# Patient Record
Sex: Male | Born: 1985 | Race: White | Hispanic: No | Marital: Single | Smoking: Never smoker
Health system: Southern US, Community
[De-identification: ages and names within clinical notes are randomized; demographics above are authoritative.]

## PROBLEM LIST (undated history)

## (undated) DIAGNOSIS — F419 Anxiety disorder, unspecified: Secondary | ICD-10-CM

## (undated) DIAGNOSIS — Z87442 Personal history of urinary calculi: Secondary | ICD-10-CM

## (undated) DIAGNOSIS — K219 Gastro-esophageal reflux disease without esophagitis: Secondary | ICD-10-CM

## (undated) HISTORY — PX: WISDOM TOOTH EXTRACTION: SHX21

## (undated) HISTORY — PX: APPENDECTOMY: SHX54

---

## 2007-02-13 ENCOUNTER — Other Ambulatory Visit: Payer: Self-pay

## 2007-02-13 ENCOUNTER — Emergency Department: Payer: Self-pay | Admitting: Emergency Medicine

## 2007-11-22 ENCOUNTER — Emergency Department: Payer: Self-pay | Admitting: Emergency Medicine

## 2007-12-13 ENCOUNTER — Emergency Department: Payer: Self-pay | Admitting: Emergency Medicine

## 2007-12-22 ENCOUNTER — Emergency Department: Payer: Self-pay | Admitting: Emergency Medicine

## 2007-12-23 ENCOUNTER — Ambulatory Visit: Payer: Self-pay | Admitting: Emergency Medicine

## 2008-12-12 IMAGING — CT CT HEAD WITHOUT CONTRAST
2 series · 16 of 30 positions shown, 20 images · non-contrast
Comparison: none

REASON FOR EXAM: ? seizure
COMMENTS:

[Series 2: without · axial · non-contrast · 0.44mm/px · z∈[-124,+0]mm · 13 of 31 slices shown, 17 images]
[im 3/31  brain]
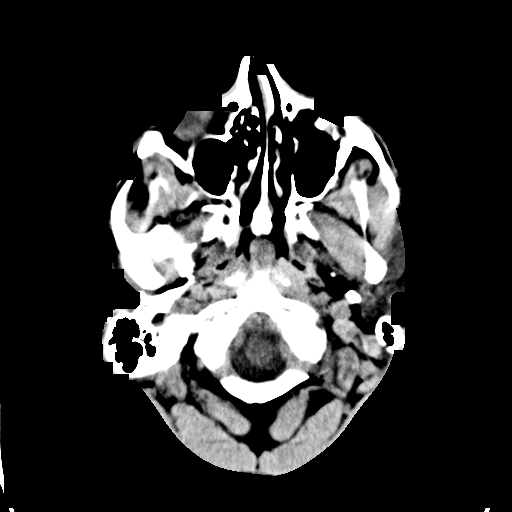
[im 3/31  bone]
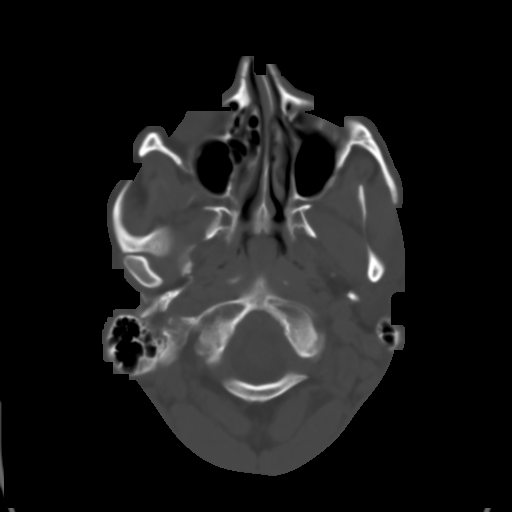
[im 5/31  brain]
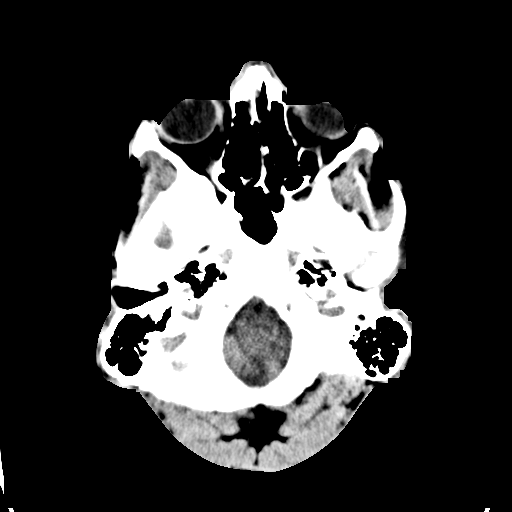
[im 7/31  brain]
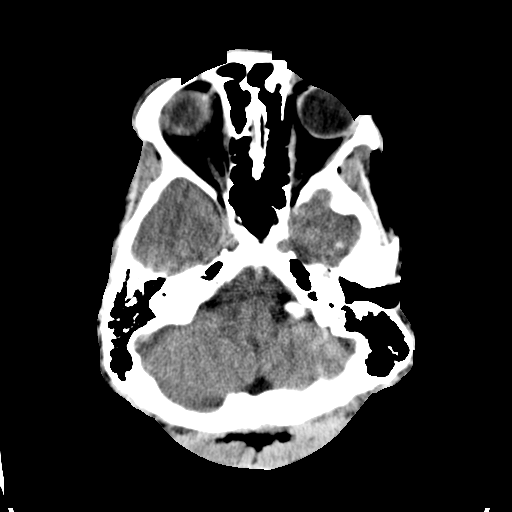
[im 9/31  brain]
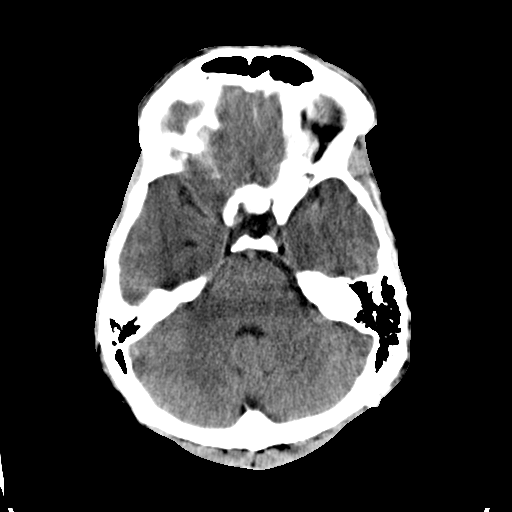
[im 11/31  brain]
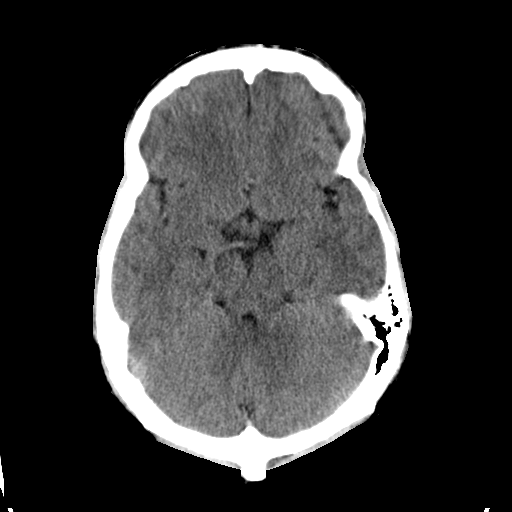
[im 11/31  bone]
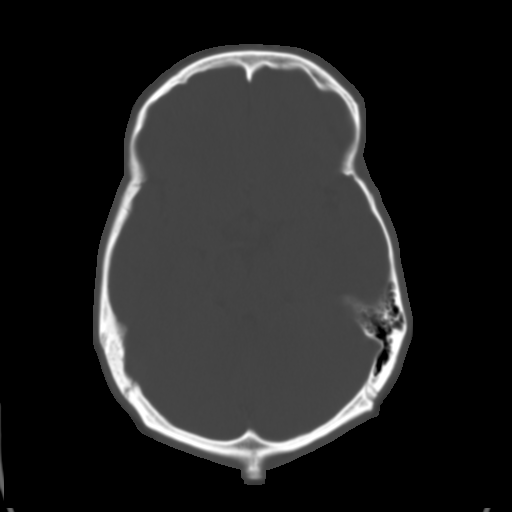
[im 13/31  brain]
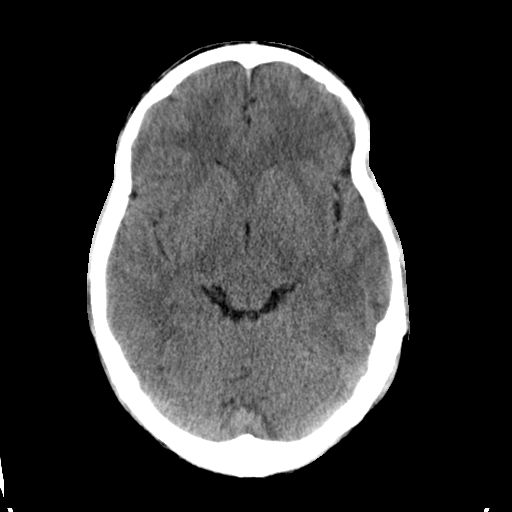
[im 16/31  brain]
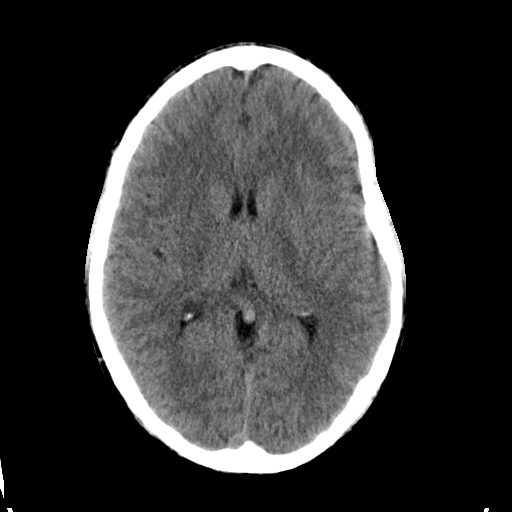
[im 18/31  brain]
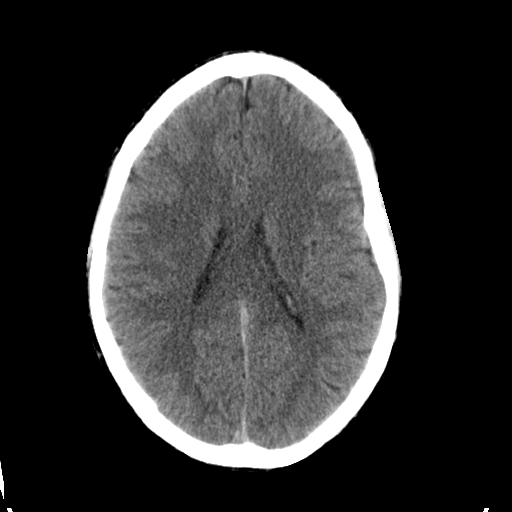
[im 20/31  brain]
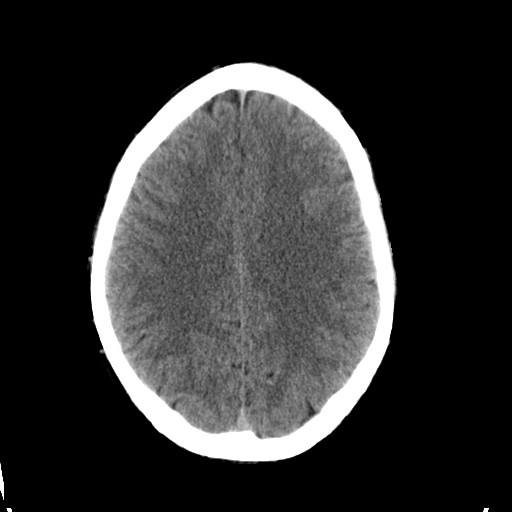
[im 20/31  bone]
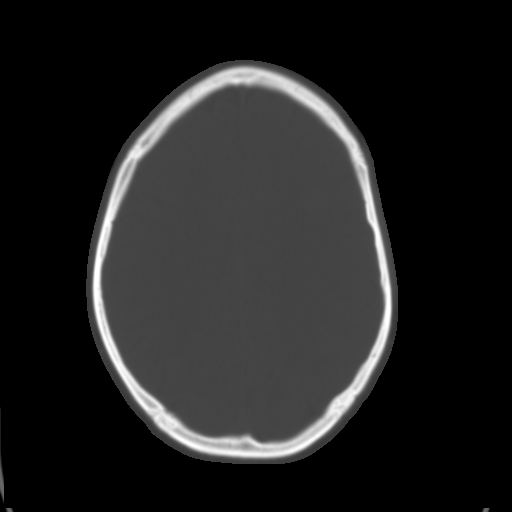
[im 22/31  brain]
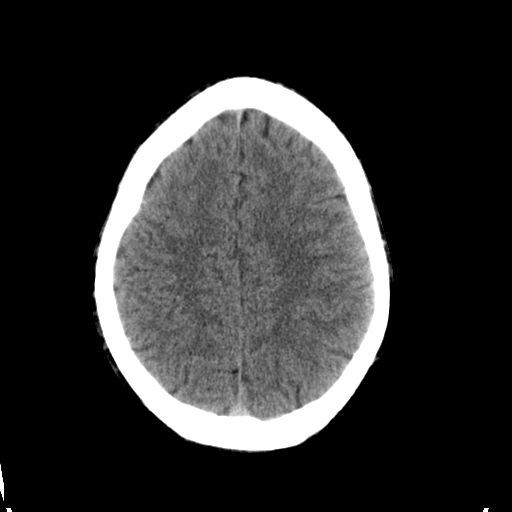
[im 24/31  brain]
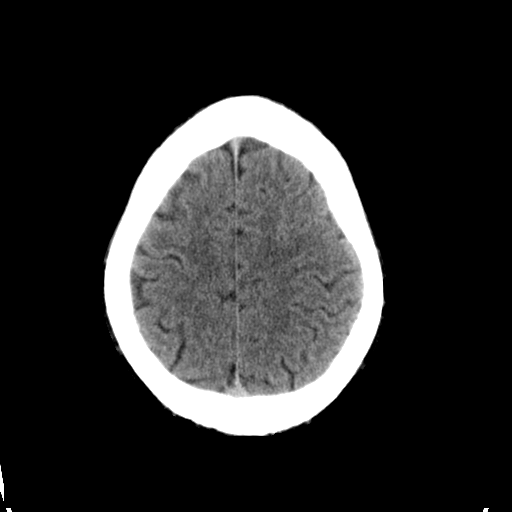
[im 26/31  brain]
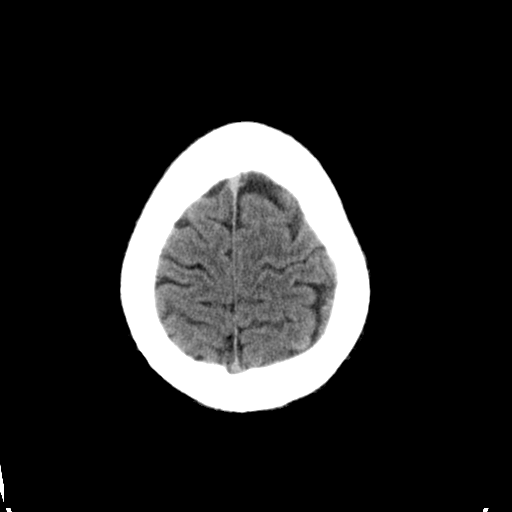
[im 28/31  brain]
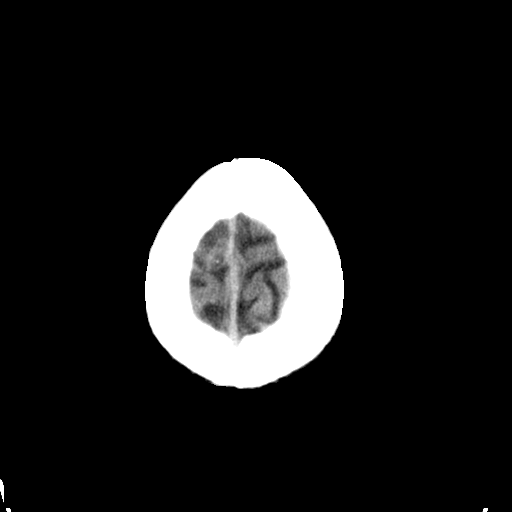
[im 28/31  bone]
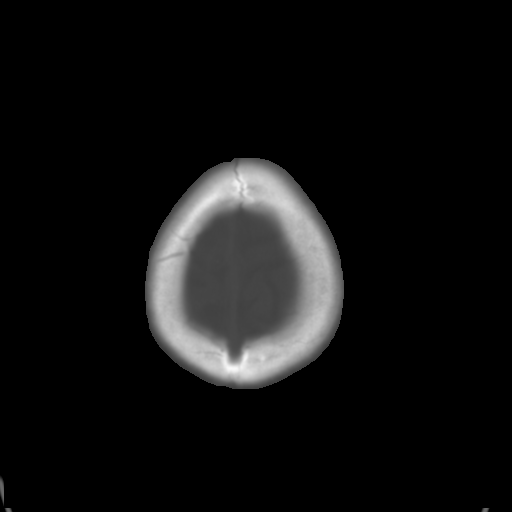

[Series 3: bone · axial · 0.44mm/px · z∈[-124,-84]mm · 3 of 31 slices shown]
[im 3/31  bone]
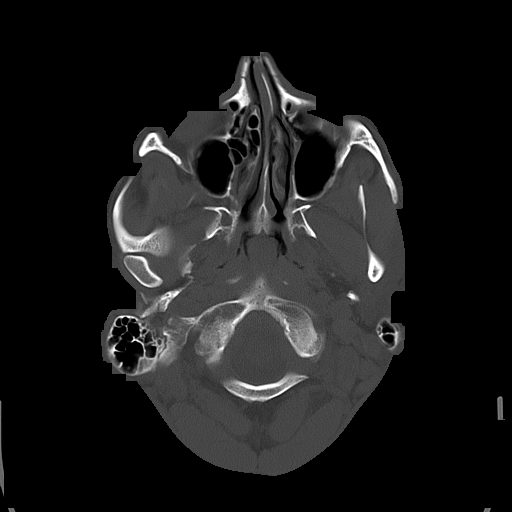
[im 7/31  bone]
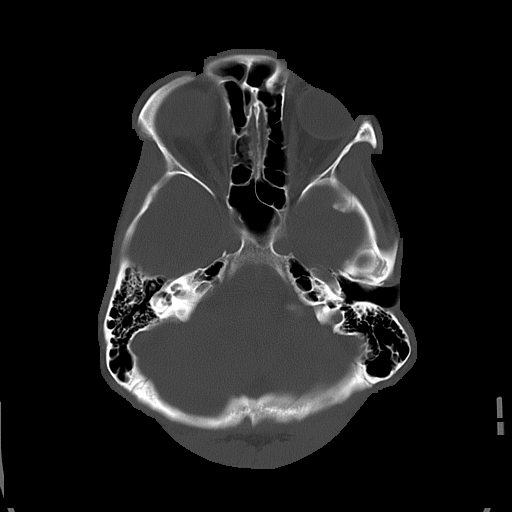
[im 11/31  bone]
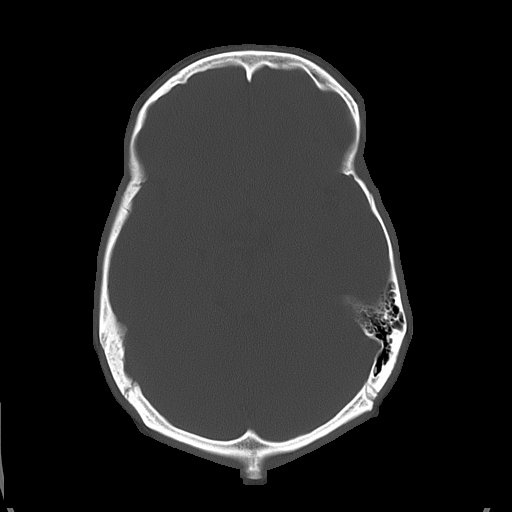

[16 of 30 positions shown; findings below may reference images not displayed]

PROCEDURE:     CT  - CT HEAD WITHOUT CONTRAST  - February 13, 2007  [DATE]

RESULT:     There is no evidence of intra-axial or extra-axial fluid
collections.  There is no evidence of acute hemorrhage or secondary signs
reflecting mass effect, subacute or chronic infarction.  The visualized bony
skeleton is evaluated and there is no evidence of depressed skull fracture
or further fracture or dislocation.  The visualized mastoid air cells are
clear.  The ventricles, cisterns and sulci are symmetric and patent.
IMPRESSION: 1)No evidence of acute intracranial abnormalities as described above.

2)If there is persistent clinical concern, further evaluation with Brain MRI
is recommended if clinically warranted.

Dr. Javaid of the Emergency Room was informed of these findings via a
preliminary faxed report on 02-13-07 at [DATE] Central Time.

## 2008-12-12 IMAGING — CR DG CHEST 1V
1 series · 1 of 1 positions shown · non-contrast
Comparison: none

REASON FOR EXAM: ? seizure
COMMENTS:

PROCEDURE:     DXR - DXR CHEST 1 VIEWAP OR PA  - February 13, 2007  [DATE]
RESULT:     The lungs are clear. The cardiac silhouette and visualized bony
skeleton are unremarkable.

[view not recorded]
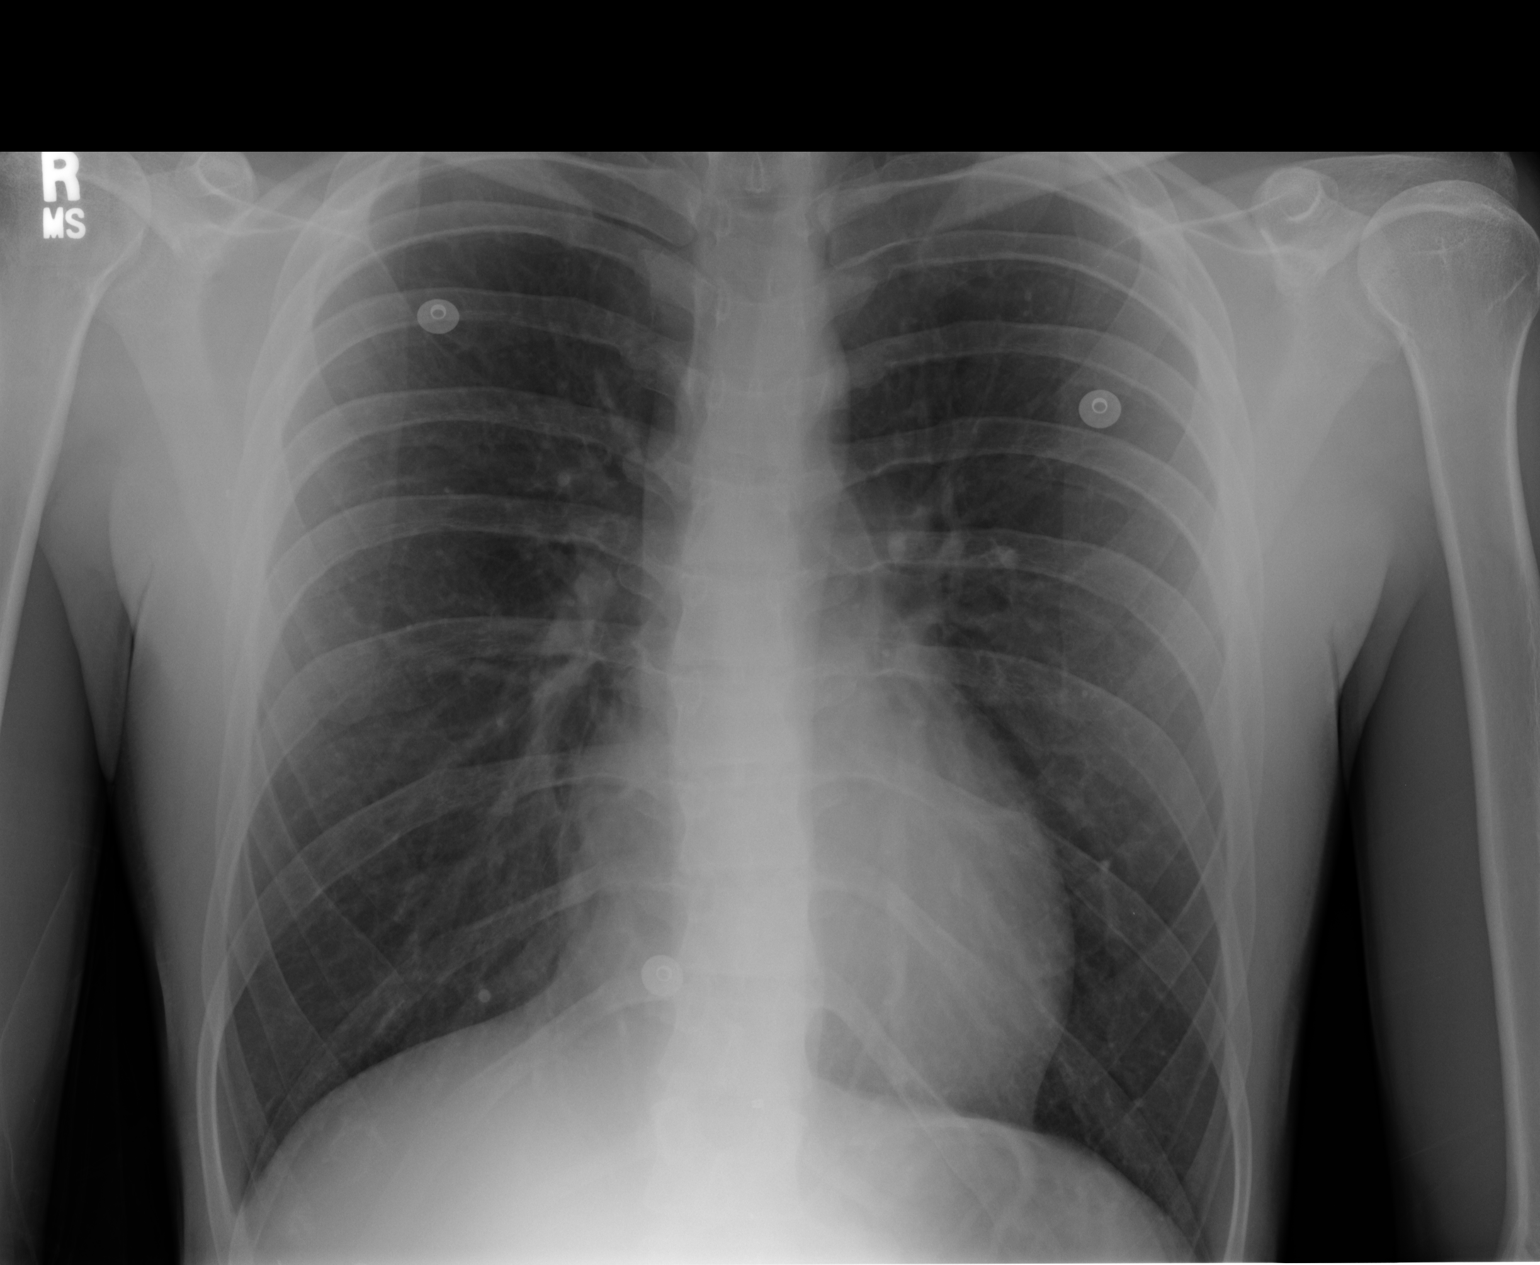

[1 of 1 positions shown; findings below may reference images not displayed]

IMPRESSION: 1)Chest radiograph without evidence of acute cardiopulmonary disease.

## 2011-08-13 ENCOUNTER — Emergency Department: Payer: Self-pay | Admitting: Emergency Medicine

## 2011-09-30 ENCOUNTER — Emergency Department: Payer: Self-pay | Admitting: Emergency Medicine

## 2011-09-30 LAB — TROPONIN I: Troponin-I: <0.02 ng/mL

## 2013-07-29 IMAGING — CR DG CHEST 2V
1 series · 4 of 4 positions shown · non-contrast
Comparison: none

REASON FOR EXAM: chest pain
COMMENTS:

[Series 1: pa · 0.17mm/px · 4 of 4 slices shown]
[im 1/4]
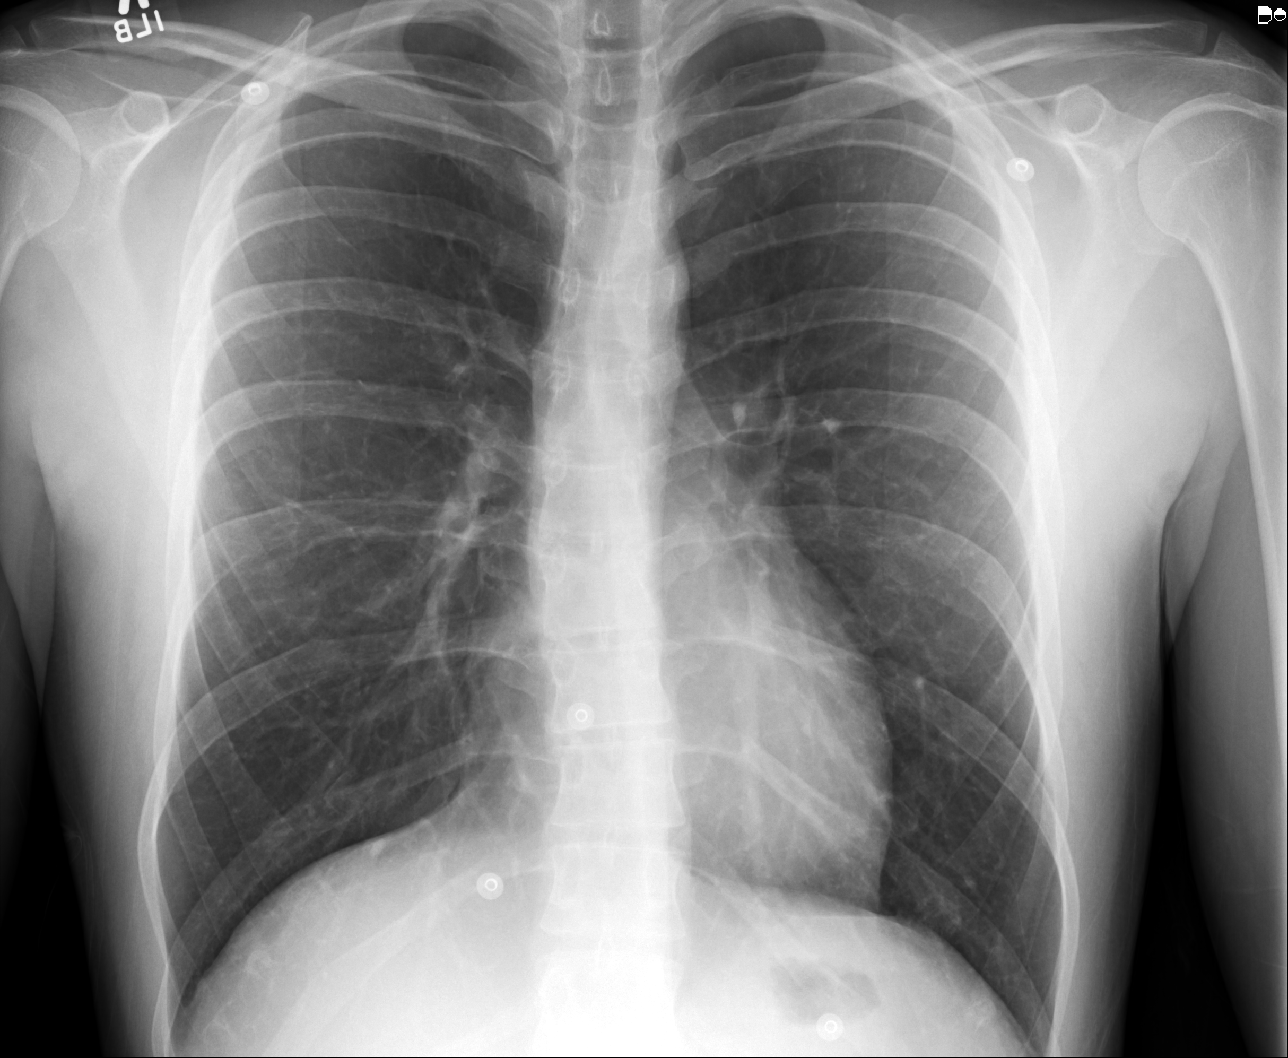
[im 2/4]
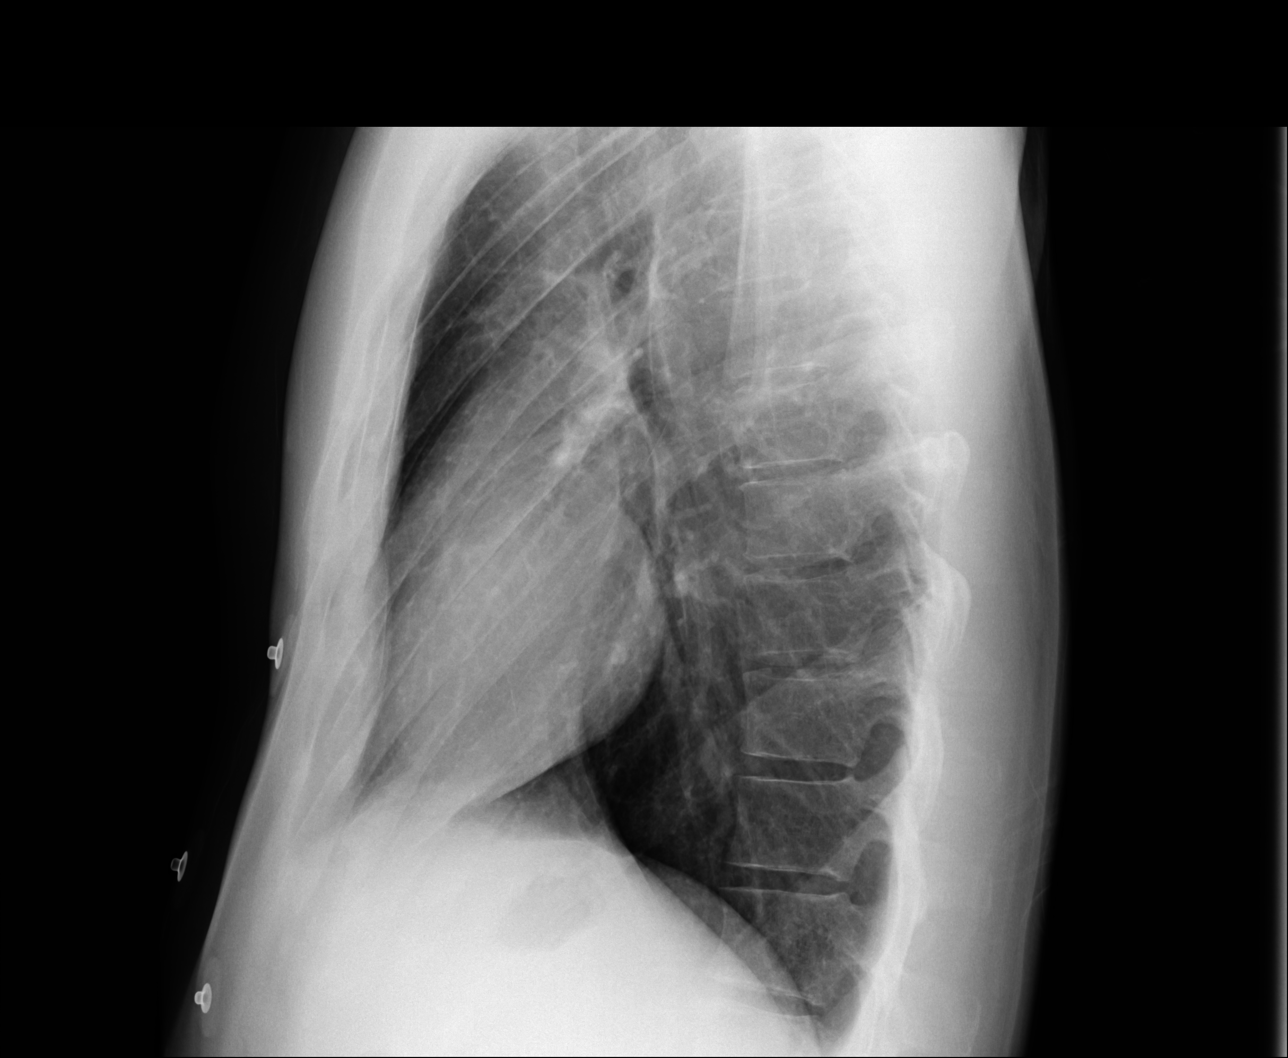
[im 3/4]
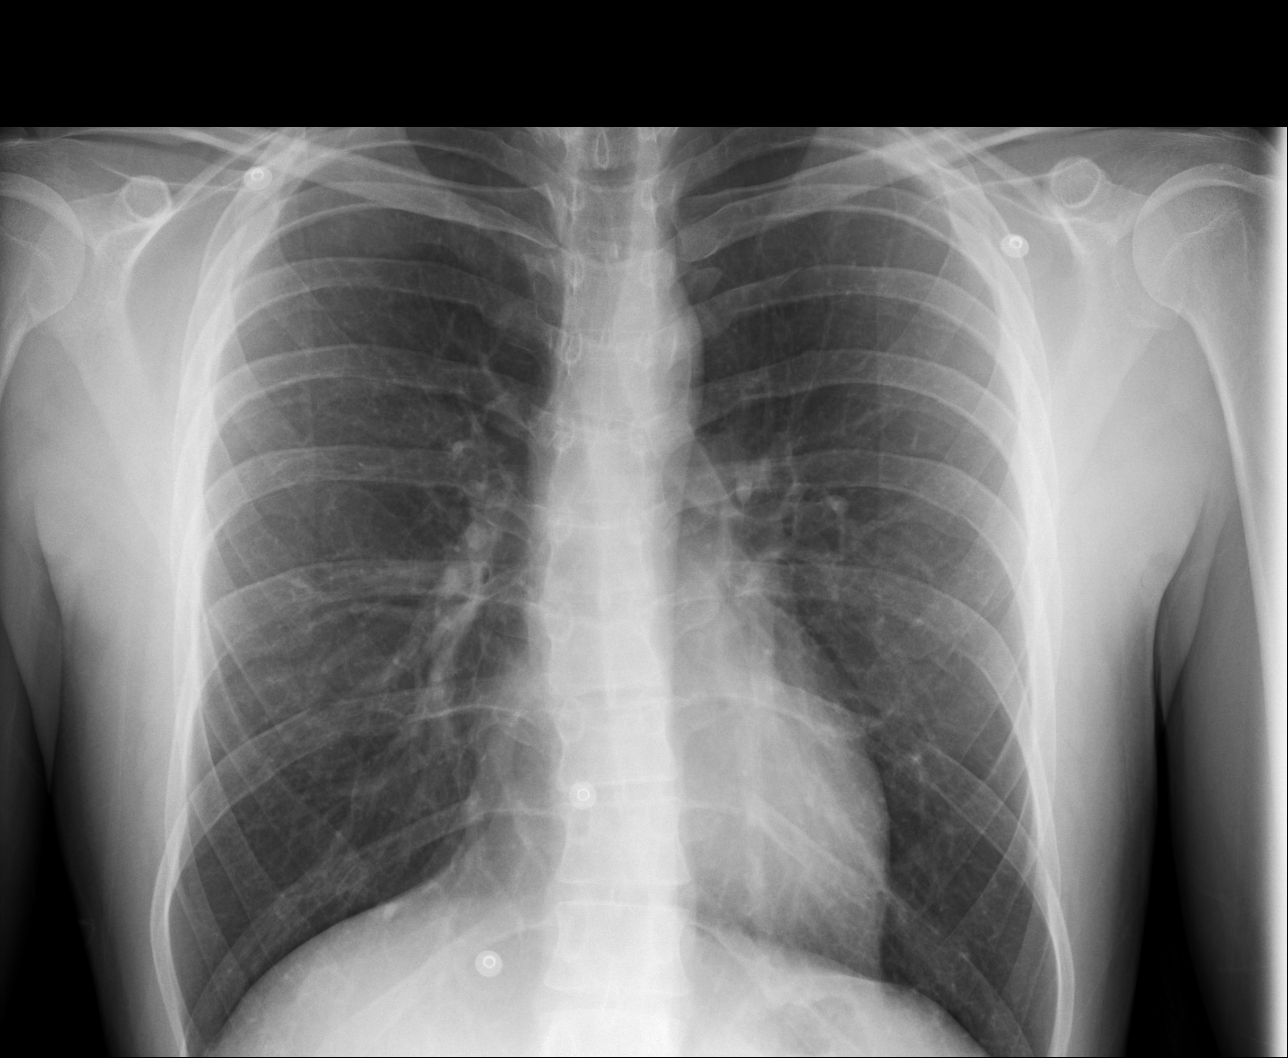
[im 4/4]
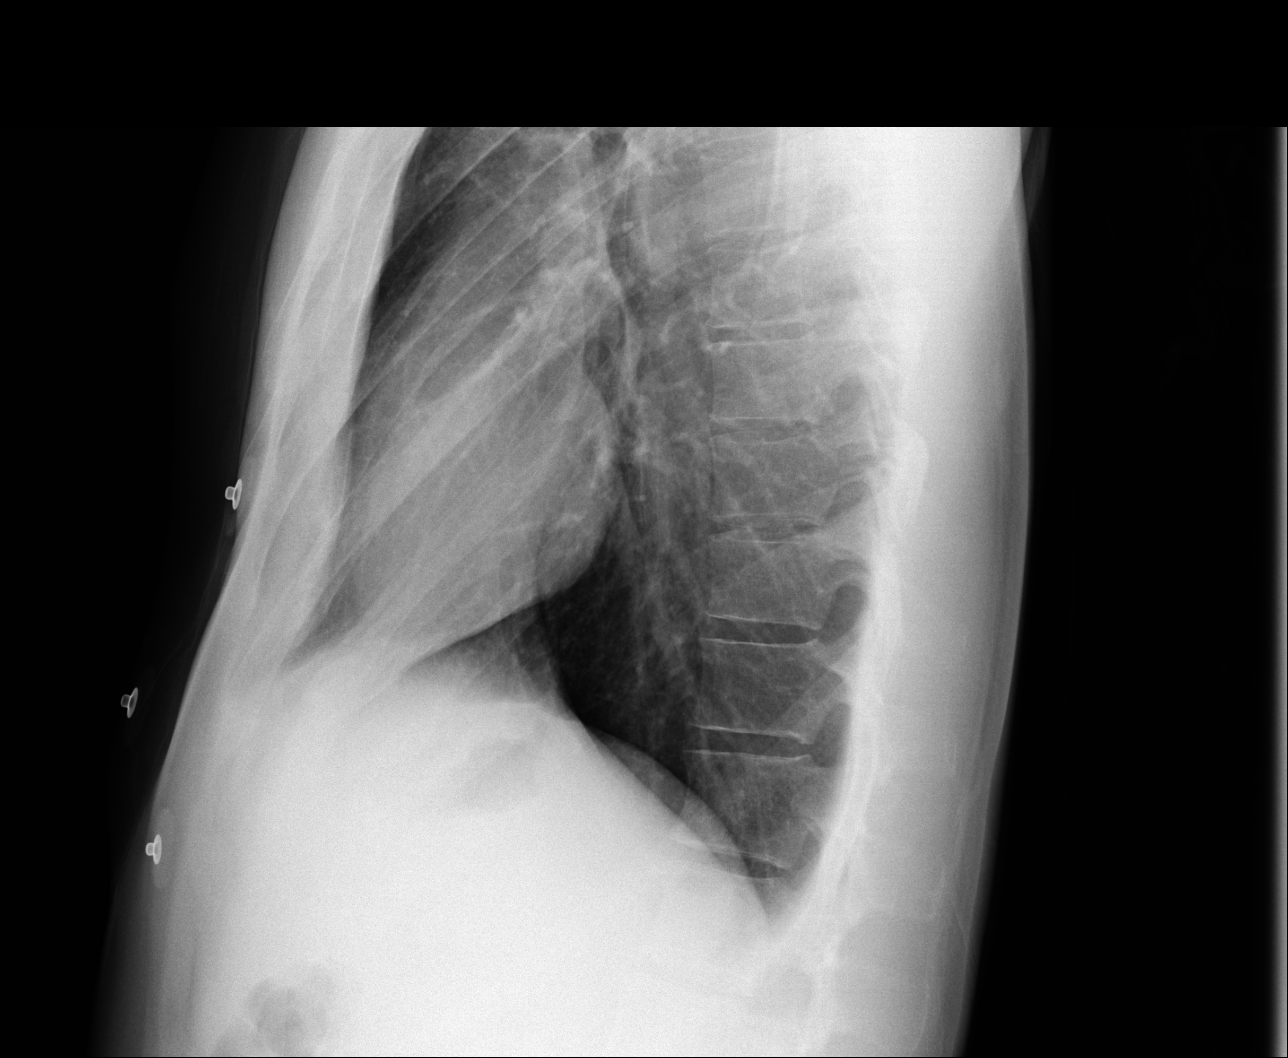

[4 of 4 positions shown; findings below may reference images not displayed]

PROCEDURE:     DXR - DXR CHEST PA (OR AP) AND LATERAL  - September 30, 2011  [DATE]

RESULT:     A comparison is made to the exam of 22 November, 2007.

The lungs are clear. The heart and pulmonary vessels are normal. The bony
and mediastinal structures are unremarkable. There is no effusion. There is
no pneumothorax or evidence of congestive failure.
IMPRESSION: No acute cardiopulmonary disease.

[REDACTED]

## 2019-01-09 HISTORY — PX: OTHER SURGICAL HISTORY: SHX169

## 2020-09-24 ENCOUNTER — Other Ambulatory Visit: Payer: Self-pay | Admitting: Neurosurgery

## 2020-10-01 NOTE — Progress Notes (Signed)
Surgical Instructions    Your procedure is scheduled on 10/12/20.  Report to [facility] Main Entrance "A" at 1:10 P.M., then check in with the Admitting office.  Call this number if you have problems the morning of surgery:  [phone]   If you have any questions prior to your surgery date call [phone]: Open Monday-Friday 8am-4pm    Remember:  Do not eat after midnight the night before your surgery  You may drink clear liquids until 12:00PM the morning of your surgery.   Clear liquids allowed are: Water, Non-Citrus Juices (without pulp), Carbonated Beverages, Clear Tea, Black Coffee Only, and Gatorade    Take these medicines the morning of surgery with A SIP OF WATER  cyclobenzaprine (FLEXERIL) if needed hydrOXYzine (VISTARIL) if needed omeprazole (PRILOSEC)  sertraline (ZOLOFT)    As of today, STOP taking any Aspirin (unless otherwise instructed by your surgeon) Aleve, Naproxen, Ibuprofen, Motrin, Advil, Goody's, BC's, all herbal medications, fish oil, and all vitamins.          Do not wear jewelry or makeup Do not wear lotions, powders, perfumes/colognes, or deodorant. Men may shave face and neck. Do not bring valuables to the hospital.  DO Not wear nail polish, gel polish, artificial nails, or any other type of covering on natural nails  including finger and toenails. If patients have artificial nails, gel coating, etc. that need to be removed by a nail salon please have this removed prior to surgery or surgery may need to be canceled/delayed if the surgeon/ anesthesia feels like the patient is unable to be adequately monitored.             [facility] is not responsible for any belongings or valuables.  Do NOT Smoke (Tobacco/Vaping) or drink Alcohol 24 hours prior to your procedure If you use a CPAP at night, you may bring all equipment for your overnight stay.   Contacts, glasses, dentures or bridgework may not be worn into surgery, please bring cases for these  belongings   For patients admitted to the hospital, discharge time will be determined by your treatment team.   Patients discharged the day of surgery will not be allowed to drive home, and someone needs to stay with them for 24 hours.  ONLY 1 SUPPORT PERSON MAY BE PRESENT WHILE YOU ARE IN SURGERY. IF YOU ARE TO BE ADMITTED ONCE YOU ARE IN YOUR ROOM YOU WILL BE ALLOWED TWO (2) VISITORS.  Minor children may have two parents present. Special consideration for safety and communication needs will be reviewed on a case by case basis.  Special instructions:    Oral Hygiene is also important to reduce your risk of infection.  Remember - BRUSH YOUR TEETH THE MORNING OF SURGERY WITH YOUR REGULAR TOOTHPASTE   [facility]- Preparing For Surgery  Before surgery, you can play an important role. Because skin is not sterile, your skin needs to be as free of germs as possible. You can reduce the number of germs on your skin by washing with CHG (chlorahexidine gluconate) Soap before surgery.  CHG is an antiseptic cleaner which kills germs and bonds with the skin to continue killing germs even after washing.     Please do not use if you have an allergy to CHG or antibacterial soaps. If your skin becomes reddened/irritated stop using the CHG.  Do not shave (including legs and underarms) for at least 48 hours prior to first CHG shower. It is OK to shave your face.  Please  follow these instructions carefully.     Shower the NIGHT BEFORE SURGERY and the MORNING OF SURGERY with CHG Soap.   If you chose to wash your hair, wash your hair first as usual with your normal shampoo. After you shampoo, rinse your hair and body thoroughly to remove the shampoo.  Then [employer] and genitals (private parts) with your normal soap and rinse thoroughly to remove soap.  After that Use CHG Soap as you would any other liquid soap. You can apply CHG directly to the skin and wash gently with a scrungie or a clean washcloth.    Apply the CHG Soap to your body ONLY FROM THE NECK DOWN.  Do not use on open wounds or open sores. Avoid contact with your eyes, ears, mouth and genitals (private parts). Wash Face and genitals (private parts)  with your normal soap.   Wash thoroughly, paying special attention to the area where your surgery will be performed.  Thoroughly rinse your body with warm water from the neck down.  DO NOT shower/wash with your normal soap after using and rinsing off the CHG Soap.  Pat yourself dry with a CLEAN TOWEL.  Wear CLEAN PAJAMAS to bed the night before surgery  Place CLEAN SHEETS on your bed the night before your surgery  DO NOT SLEEP WITH PETS.   Day of Surgery: Take a shower with CHG soap. Wear Clean/Comfortable clothing the morning of surgery Do not apply any deodorants/lotions.   Remember to brush your teeth WITH YOUR REGULAR TOOTHPASTE.   Please read over the following fact sheets that you were given.

## 2020-10-02 ENCOUNTER — Encounter (HOSPITAL_COMMUNITY)
Admission: RE | Admit: 2020-10-02 | Discharge: 2020-10-02 | Disposition: A | Discharge status: Home / Self Care | Payer: No Typology Code available for payment source | Source: Ambulatory Visit | Attending: Neurosurgery | Admitting: Neurosurgery

## 2020-10-02 ENCOUNTER — Other Ambulatory Visit: Payer: Self-pay

## 2020-10-02 ENCOUNTER — Encounter (HOSPITAL_COMMUNITY): Payer: Self-pay

## 2020-10-02 DIAGNOSIS — Z01812 Encounter for preprocedural laboratory examination: Secondary | ICD-10-CM | POA: Diagnosis not present

## 2020-10-02 HISTORY — DX: Personal history of urinary calculi: Z87.442

## 2020-10-02 HISTORY — DX: Gastro-esophageal reflux disease without esophagitis: K21.9

## 2020-10-02 HISTORY — DX: Anxiety disorder, unspecified: F41.9

## 2020-10-02 LAB — CBC
HCT: 45.5 % (ref 39.0–52.0)
Hemoglobin: 16 g/dL (ref 13.0–17.0)
MCH: 31.1 pg (ref 26.0–34.0)
MCHC: 35.2 g/dL (ref 30.0–36.0)
MCV: 88.5 fL (ref 80.0–100.0)
Platelets: 259 10*3/uL (ref 150–400)
RBC: 5.14 MIL/uL (ref 4.22–5.81)
RDW: 12.6 % (ref 11.5–15.5)
WBC: 6.2 10*3/uL (ref 4.0–10.5)
nRBC: 0 % (ref 0.0–0.2)

## 2020-10-02 LAB — SURGICAL PCR SCREEN
MRSA, PCR: NEGATIVE
Staphylococcus aureus: NEGATIVE

## 2020-10-02 LAB — TYPE AND SCREEN
ABO/RH(D): B POS
Antibody Screen: NEGATIVE

## 2020-10-02 NOTE — Progress Notes (Addendum)
PCP - Thornell Sartorius, PA-C Cardiologist - denies Neurologist: Dr. Lorenso Courier at [facility] (notes in care everywhere)  PPM/ICD - denies   Chest x-ray - n/a EKG - 04/2020 (records requested) Stress Test - denies ECHO - denies Cardiac Cath - denies  Sleep Study - denies   No diabetes  As of today, STOP taking any Aspirin (unless otherwise instructed by your surgeon) Aleve, Naproxen, Ibuprofen, Motrin, Advil, Goody's, BC's, all herbal medications, fish oil, and all vitamins.  ERAS Protcol -yes PRE-SURGERY Ensure or G2- no  COVID TEST- informed patient to go to [facility] location and get covid tested anytime between 8/1-8/4   Anesthesia review: no  Patient denies shortness of breath, fever, cough and chest pain at PAT appointment   All instructions explained to the patient, with a verbal understanding of the material. Patient agrees to go over the instructions while at home for a better understanding. Patient also instructed to self quarantine after being tested for COVID-19. The opportunity to ask questions was provided.

## 2020-10-08 ENCOUNTER — Other Ambulatory Visit: Payer: Self-pay | Admitting: Neurosurgery

## 2020-10-09 LAB — SARS CORONAVIRUS 2 (TAT 6-24 HRS): SARS Coronavirus 2: NEGATIVE

## 2020-10-12 ENCOUNTER — Ambulatory Visit (HOSPITAL_COMMUNITY): Payer: No Typology Code available for payment source | Admitting: Certified Registered Nurse Anesthetist

## 2020-10-12 ENCOUNTER — Observation Stay (HOSPITAL_COMMUNITY)
Admission: RE | Admit: 2020-10-12 | Discharge: 2020-10-13 | Disposition: A | Discharge status: Home / Self Care | Payer: No Typology Code available for payment source | Attending: Neurosurgery | Admitting: Neurosurgery

## 2020-10-12 ENCOUNTER — Encounter (HOSPITAL_COMMUNITY)
Admission: RE | Disposition: A | Discharge status: Home / Self Care | Payer: Self-pay | Source: Home / Self Care | Attending: Neurosurgery

## 2020-10-12 ENCOUNTER — Ambulatory Visit (HOSPITAL_COMMUNITY): Payer: No Typology Code available for payment source

## 2020-10-12 ENCOUNTER — Encounter (HOSPITAL_COMMUNITY): Payer: Self-pay | Admitting: Neurosurgery

## 2020-10-12 ENCOUNTER — Other Ambulatory Visit: Payer: Self-pay

## 2020-10-12 DIAGNOSIS — M5020 Other cervical disc displacement, unspecified cervical region: Secondary | ICD-10-CM | POA: Diagnosis present

## 2020-10-12 DIAGNOSIS — Z419 Encounter for procedure for purposes other than remedying health state, unspecified: Secondary | ICD-10-CM

## 2020-10-12 DIAGNOSIS — M4722 Other spondylosis with radiculopathy, cervical region: Secondary | ICD-10-CM | POA: Insufficient documentation

## 2020-10-12 DIAGNOSIS — R030 Elevated blood-pressure reading, without diagnosis of hypertension: Secondary | ICD-10-CM | POA: Diagnosis not present

## 2020-10-12 DIAGNOSIS — M4802 Spinal stenosis, cervical region: Secondary | ICD-10-CM | POA: Diagnosis not present

## 2020-10-12 DIAGNOSIS — M50122 Cervical disc disorder at C5-C6 level with radiculopathy: Principal | ICD-10-CM | POA: Insufficient documentation

## 2020-10-12 HISTORY — PX: CERVICAL DISC ARTHROPLASTY: SHX587

## 2020-10-12 LAB — ABO/RH: ABO/RH(D): B POS

## 2020-10-12 SURGERY — CERVICAL ANTERIOR DISC ARTHROPLASTY
Anesthesia: General

## 2020-10-12 MED ORDER — OXYCODONE HCL 5 MG PO TABS
5.0000 mg | ORAL_TABLET | Freq: Once | ORAL | Status: AC | PRN
  Administered 2020-10-12: 5 mg via ORAL

## 2020-10-12 MED ORDER — SODIUM CHLORIDE 0.9 % IV SOLN: 250.0000 mL | INTRAVENOUS | Status: DC

## 2020-10-12 MED ORDER — MIDAZOLAM HCL 2 MG/2ML IJ SOLN
INTRAMUSCULAR | Status: DC | PRN
Start: 2020-10-12 — End: 2020-10-12
  Administered 2020-10-12: 2 mg via INTRAVENOUS

## 2020-10-12 MED ORDER — OXYCODONE HCL 5 MG PO TABS
5.0000 mg | ORAL_TABLET | ORAL | Status: DC | PRN
  Administered 2020-10-13: 5 mg via ORAL
  Filled 2020-10-12: qty 1

## 2020-10-12 MED ORDER — ACETAMINOPHEN 160 MG/5ML PO SOLN: 325.0000 mg | ORAL | Status: DC | PRN

## 2020-10-12 MED ORDER — CEFAZOLIN SODIUM-DEXTROSE 2-4 GM/100ML-% IV SOLN
2.0000 g | INTRAVENOUS | Status: AC
  Administered 2020-10-12: 2 g via INTRAVENOUS
  Filled 2020-10-12: qty 100

## 2020-10-12 MED ORDER — DEXAMETHASONE SODIUM PHOSPHATE 10 MG/ML IJ SOLN
INTRAMUSCULAR | Status: DC | PRN
  Administered 2020-10-12: 10 mg via INTRAVENOUS

## 2020-10-12 MED ORDER — CYCLOBENZAPRINE HCL 10 MG PO TABS
10.0000 mg | ORAL_TABLET | Freq: Two times a day (BID) | ORAL | Status: DC | PRN
  Administered 2020-10-12 – 2020-10-13 (×2): 10 mg via ORAL
  Filled 2020-10-12 (×2): qty 1

## 2020-10-12 MED ORDER — THROMBIN 5000 UNITS EX SOLR: OROMUCOSAL | Status: DC | PRN

## 2020-10-12 MED ORDER — FENTANYL CITRATE (PF) 100 MCG/2ML IJ SOLN
INTRAMUSCULAR | Status: AC
  Filled 2020-10-12: qty 2

## 2020-10-12 MED ORDER — DOCUSATE SODIUM 100 MG PO CAPS
100.0000 mg | ORAL_CAPSULE | Freq: Two times a day (BID) | ORAL | Status: DC
  Administered 2020-10-12 – 2020-10-13 (×2): 100 mg via ORAL
  Filled 2020-10-12 (×2): qty 1

## 2020-10-12 MED ORDER — ROCURONIUM BROMIDE 10 MG/ML (PF) SYRINGE
PREFILLED_SYRINGE | INTRAVENOUS | Status: AC
  Filled 2020-10-12: qty 10

## 2020-10-12 MED ORDER — PROPOFOL 10 MG/ML IV BOLUS
INTRAVENOUS | Status: DC | PRN
  Administered 2020-10-12: 200 mg via INTRAVENOUS

## 2020-10-12 MED ORDER — HYDROCODONE-ACETAMINOPHEN 5-325 MG PO TABS
2.0000 | ORAL_TABLET | ORAL | Status: DC | PRN
  Administered 2020-10-13 (×2): 2 via ORAL
  Filled 2020-10-12 (×2): qty 2

## 2020-10-12 MED ORDER — SODIUM CHLORIDE 0.9% FLUSH: 3.0000 mL | INTRAVENOUS | Status: DC | PRN

## 2020-10-12 MED ORDER — CHLORHEXIDINE GLUCONATE CLOTH 2 % EX PADS: 6.0000 | MEDICATED_PAD | Freq: Once | CUTANEOUS | Status: DC

## 2020-10-12 MED ORDER — FENTANYL CITRATE (PF) 250 MCG/5ML IJ SOLN
INTRAMUSCULAR | Status: AC
  Filled 2020-10-12: qty 5

## 2020-10-12 MED ORDER — ACETAMINOPHEN 325 MG PO TABS
650.0000 mg | ORAL_TABLET | ORAL | Status: DC | PRN
  Administered 2020-10-13: 650 mg via ORAL
  Filled 2020-10-12: qty 2

## 2020-10-12 MED ORDER — THROMBIN 5000 UNITS EX SOLR
CUTANEOUS | Status: AC
  Filled 2020-10-12: qty 5000

## 2020-10-12 MED ORDER — ZOLPIDEM TARTRATE 5 MG PO TABS
10.0000 mg | ORAL_TABLET | Freq: Every day | ORAL | Status: DC
  Administered 2020-10-12: 10 mg via ORAL
  Filled 2020-10-12: qty 2

## 2020-10-12 MED ORDER — KETOROLAC TROMETHAMINE 15 MG/ML IJ SOLN
15.0000 mg | Freq: Four times a day (QID) | INTRAMUSCULAR | Status: DC
Start: 2020-10-12 — End: 2020-10-13
  Administered 2020-10-12 – 2020-10-13 (×3): 15 mg via INTRAVENOUS
  Filled 2020-10-12 (×3): qty 1

## 2020-10-12 MED ORDER — AMISULPRIDE (ANTIEMETIC) 5 MG/2ML IV SOLN
INTRAVENOUS | Status: AC
  Filled 2020-10-12: qty 4

## 2020-10-12 MED ORDER — OXYCODONE HCL 5 MG PO TABS
ORAL_TABLET | ORAL | Status: AC
  Filled 2020-10-12: qty 1

## 2020-10-12 MED ORDER — ZOLPIDEM TARTRATE 5 MG PO TABS: 5.0000 mg | ORAL_TABLET | Freq: Every evening | ORAL | Status: DC | PRN

## 2020-10-12 MED ORDER — FENTANYL CITRATE (PF) 100 MCG/2ML IJ SOLN
25.0000 ug | INTRAMUSCULAR | Status: DC | PRN
  Administered 2020-10-12 (×3): 50 ug via INTRAVENOUS

## 2020-10-12 MED ORDER — POLYETHYLENE GLYCOL 3350 17 G PO PACK: 17.0000 g | PACK | Freq: Every day | ORAL | Status: DC | PRN

## 2020-10-12 MED ORDER — ACETAMINOPHEN 325 MG PO TABS: 325.0000 mg | ORAL_TABLET | ORAL | Status: DC | PRN

## 2020-10-12 MED ORDER — MIDAZOLAM HCL 2 MG/2ML IJ SOLN
INTRAMUSCULAR | Status: AC
  Filled 2020-10-12: qty 2

## 2020-10-12 MED ORDER — LACTATED RINGERS IV SOLN: INTRAVENOUS | Status: DC | PRN

## 2020-10-12 MED ORDER — PANTOPRAZOLE SODIUM 40 MG IV SOLR
40.0000 mg | Freq: Every day | INTRAVENOUS | Status: DC
  Administered 2020-10-12: 40 mg via INTRAVENOUS
  Filled 2020-10-12: qty 40

## 2020-10-12 MED ORDER — LIDOCAINE-EPINEPHRINE 1 %-1:100000 IJ SOLN
INTRAMUSCULAR | Status: AC
  Filled 2020-10-12: qty 1

## 2020-10-12 MED ORDER — HYDROXYZINE PAMOATE 25 MG PO CAPS
25.0000 mg | ORAL_CAPSULE | Freq: Three times a day (TID) | ORAL | Status: DC | PRN
  Filled 2020-10-12: qty 1

## 2020-10-12 MED ORDER — BUPIVACAINE HCL (PF) 0.5 % IJ SOLN
INTRAMUSCULAR | Status: DC | PRN
  Administered 2020-10-12: 5 mL

## 2020-10-12 MED ORDER — ACETAMINOPHEN 10 MG/ML IV SOLN
1000.0000 mg | Freq: Once | INTRAVENOUS | Status: DC | PRN
  Administered 2020-10-12: 1000 mg via INTRAVENOUS

## 2020-10-12 MED ORDER — ACETAMINOPHEN 10 MG/ML IV SOLN
INTRAVENOUS | Status: AC
  Filled 2020-10-12: qty 100

## 2020-10-12 MED ORDER — ONDANSETRON HCL 4 MG/2ML IJ SOLN: 4.0000 mg | Freq: Four times a day (QID) | INTRAMUSCULAR | Status: DC | PRN

## 2020-10-12 MED ORDER — 0.9 % SODIUM CHLORIDE (POUR BTL) OPTIME
TOPICAL | Status: DC | PRN
  Administered 2020-10-12: 1000 mL

## 2020-10-12 MED ORDER — BUPIVACAINE HCL (PF) 0.5 % IJ SOLN
INTRAMUSCULAR | Status: AC
  Filled 2020-10-12: qty 30

## 2020-10-12 MED ORDER — HYDROXYZINE HCL 25 MG PO TABS: 25.0000 mg | ORAL_TABLET | Freq: Three times a day (TID) | ORAL | Status: DC | PRN

## 2020-10-12 MED ORDER — LIDOCAINE-EPINEPHRINE 1 %-1:100000 IJ SOLN
INTRAMUSCULAR | Status: DC | PRN
  Administered 2020-10-12: 5 mL

## 2020-10-12 MED ORDER — ROCURONIUM BROMIDE 10 MG/ML (PF) SYRINGE
PREFILLED_SYRINGE | INTRAVENOUS | Status: DC | PRN
  Administered 2020-10-12: 70 mg via INTRAVENOUS
  Administered 2020-10-12: 10 mg via INTRAVENOUS

## 2020-10-12 MED ORDER — PROPOFOL 10 MG/ML IV BOLUS
INTRAVENOUS | Status: AC
  Filled 2020-10-12: qty 20

## 2020-10-12 MED ORDER — METHOCARBAMOL 1000 MG/10ML IJ SOLN
500.0000 mg | Freq: Four times a day (QID) | INTRAVENOUS | Status: DC | PRN
  Filled 2020-10-12: qty 5

## 2020-10-12 MED ORDER — ONDANSETRON HCL 4 MG/2ML IJ SOLN
INTRAMUSCULAR | Status: DC | PRN
  Administered 2020-10-12: 4 mg via INTRAVENOUS

## 2020-10-12 MED ORDER — CHLORHEXIDINE GLUCONATE 0.12 % MT SOLN
15.0000 mL | Freq: Once | OROMUCOSAL | Status: AC
  Administered 2020-10-12: 15 mL via OROMUCOSAL
  Filled 2020-10-12: qty 15

## 2020-10-12 MED ORDER — AMISULPRIDE (ANTIEMETIC) 5 MG/2ML IV SOLN
10.0000 mg | Freq: Once | INTRAVENOUS | Status: AC | PRN
  Administered 2020-10-12: 10 mg via INTRAVENOUS

## 2020-10-12 MED ORDER — PROMETHAZINE HCL 25 MG/ML IJ SOLN: 6.2500 mg | INTRAMUSCULAR | Status: DC | PRN

## 2020-10-12 MED ORDER — LIDOCAINE 2% (20 MG/ML) 5 ML SYRINGE
INTRAMUSCULAR | Status: AC
  Filled 2020-10-12: qty 5

## 2020-10-12 MED ORDER — ORAL CARE MOUTH RINSE: 15.0000 mL | Freq: Once | OROMUCOSAL | Status: AC

## 2020-10-12 MED ORDER — ONDANSETRON HCL 4 MG/2ML IJ SOLN
INTRAMUSCULAR | Status: AC
  Filled 2020-10-12: qty 2

## 2020-10-12 MED ORDER — MENTHOL 3 MG MT LOZG: 1.0000 | LOZENGE | OROMUCOSAL | Status: DC | PRN

## 2020-10-12 MED ORDER — PHENOL 1.4 % MT LIQD: 1.0000 | OROMUCOSAL | Status: DC | PRN

## 2020-10-12 MED ORDER — ALUM & MAG HYDROXIDE-SIMETH 200-200-20 MG/5ML PO SUSP: 30.0000 mL | Freq: Four times a day (QID) | ORAL | Status: DC | PRN

## 2020-10-12 MED ORDER — HYDROMORPHONE HCL 1 MG/ML IJ SOLN
0.5000 mg | INTRAMUSCULAR | Status: DC | PRN
Start: 2020-10-12 — End: 2020-10-13

## 2020-10-12 MED ORDER — OXYCODONE HCL 5 MG/5ML PO SOLN: 5.0000 mg | Freq: Once | ORAL | Status: AC | PRN

## 2020-10-12 MED ORDER — FENTANYL CITRATE (PF) 250 MCG/5ML IJ SOLN
INTRAMUSCULAR | Status: DC | PRN
  Administered 2020-10-12: 100 ug via INTRAVENOUS
  Administered 2020-10-12: 50 ug via INTRAVENOUS
  Administered 2020-10-12: 100 ug via INTRAVENOUS

## 2020-10-12 MED ORDER — ONDANSETRON HCL 4 MG PO TABS: 4.0000 mg | ORAL_TABLET | Freq: Four times a day (QID) | ORAL | Status: DC | PRN

## 2020-10-12 MED ORDER — LACTATED RINGERS IV SOLN: INTRAVENOUS | Status: DC

## 2020-10-12 MED ORDER — BISACODYL 10 MG RE SUPP: 10.0000 mg | Freq: Every day | RECTAL | Status: DC | PRN

## 2020-10-12 MED ORDER — SERTRALINE HCL 50 MG PO TABS
50.0000 mg | ORAL_TABLET | Freq: Every day | ORAL | Status: DC
  Administered 2020-10-13: 50 mg via ORAL
  Filled 2020-10-12: qty 1

## 2020-10-12 MED ORDER — CEFAZOLIN SODIUM-DEXTROSE 2-4 GM/100ML-% IV SOLN
2.0000 g | Freq: Three times a day (TID) | INTRAVENOUS | Status: AC
Start: 2020-10-12 — End: 2020-10-13
  Administered 2020-10-12 – 2020-10-13 (×2): 2 g via INTRAVENOUS
  Filled 2020-10-12 (×2): qty 100

## 2020-10-12 MED ORDER — KCL IN DEXTROSE-NACL 20-5-0.45 MEQ/L-%-% IV SOLN: INTRAVENOUS | Status: DC

## 2020-10-12 MED ORDER — DEXAMETHASONE SODIUM PHOSPHATE 10 MG/ML IJ SOLN
INTRAMUSCULAR | Status: AC
  Filled 2020-10-12: qty 1

## 2020-10-12 MED ORDER — FLEET ENEMA 7-19 GM/118ML RE ENEM: 1.0000 | ENEMA | Freq: Once | RECTAL | Status: DC | PRN

## 2020-10-12 MED ORDER — PANTOPRAZOLE SODIUM 40 MG PO TBEC: 40.0000 mg | DELAYED_RELEASE_TABLET | Freq: Every day | ORAL | Status: DC

## 2020-10-12 MED ORDER — SODIUM CHLORIDE 0.9% FLUSH: 3.0000 mL | Freq: Two times a day (BID) | INTRAVENOUS | Status: DC

## 2020-10-12 MED ORDER — ACETAMINOPHEN 650 MG RE SUPP: 650.0000 mg | RECTAL | Status: DC | PRN

## 2020-10-12 MED ORDER — SUGAMMADEX SODIUM 200 MG/2ML IV SOLN
INTRAVENOUS | Status: DC | PRN
  Administered 2020-10-12: 300 mg via INTRAVENOUS

## 2020-10-12 MED ORDER — METHOCARBAMOL 500 MG PO TABS
500.0000 mg | ORAL_TABLET | Freq: Four times a day (QID) | ORAL | Status: DC | PRN
  Administered 2020-10-13: 500 mg via ORAL
  Filled 2020-10-12: qty 1

## 2020-10-12 MED ORDER — LIDOCAINE 2% (20 MG/ML) 5 ML SYRINGE
INTRAMUSCULAR | Status: DC | PRN
  Administered 2020-10-12: 30 mg via INTRAVENOUS

## 2020-10-12 SURGICAL SUPPLY — 53 items
BAG COUNTER SPONGE SURGICOUNT (BAG) ×4 IMPLANT
BAND RUBBER #18 3X1/16 STRL (MISCELLANEOUS) ×4 IMPLANT
BASKET BONE COLLECTION (BASKET) IMPLANT
BIT DRILL NEURO 2X3.1 SFT TUCH (MISCELLANEOUS) ×1 IMPLANT
BLADE ULTRA TIP 2M (BLADE) IMPLANT
BUR BARREL STRAIGHT FLUTE 4.0 (BURR) ×2 IMPLANT
CANISTER SUCT 3000ML PPV (MISCELLANEOUS) ×2 IMPLANT
CARTRIDGE OIL MAESTRO DRILL (MISCELLANEOUS) ×1 IMPLANT
DECANTER SPIKE VIAL GLASS SM (MISCELLANEOUS) IMPLANT
DERMABOND ADVANCED (GAUZE/BANDAGES/DRESSINGS) ×1
DERMABOND ADVANCED .7 DNX12 (GAUZE/BANDAGES/DRESSINGS) ×1 IMPLANT
DIFFUSER DRILL AIR PNEUMATIC (MISCELLANEOUS) ×2 IMPLANT
DISC MOBI-C CERVICAL 17X17 H5 (Screw) ×2 IMPLANT
DRAPE C-ARM 42X72 X-RAY (DRAPES) ×6 IMPLANT
DRAPE HALF SHEET 40X57 (DRAPES) IMPLANT
DRAPE LAPAROTOMY 100X72 PEDS (DRAPES) ×2 IMPLANT
DRAPE MICROSCOPE LEICA (MISCELLANEOUS) ×2 IMPLANT
DRILL NEURO 2X3.1 SOFT TOUCH (MISCELLANEOUS) ×2
DRSG OPSITE POSTOP 4X6 (GAUZE/BANDAGES/DRESSINGS) ×2 IMPLANT
DRSG PAD ABDOMINAL 8X10 ST (GAUZE/BANDAGES/DRESSINGS) ×4 IMPLANT
DURAPREP 6ML APPLICATOR 50/CS (WOUND CARE) ×2 IMPLANT
ELECT COATED BLADE 2.86 ST (ELECTRODE) ×2 IMPLANT
ELECT REM PT RETURN 9FT ADLT (ELECTROSURGICAL) ×2
ELECTRODE REM PT RTRN 9FT ADLT (ELECTROSURGICAL) ×1 IMPLANT
GAUZE 4X4 16PLY ~~LOC~~+RFID DBL (SPONGE) IMPLANT
GAUZE SPONGE 4X4 12PLY STRL (GAUZE/BANDAGES/DRESSINGS) IMPLANT
GLOVE EXAM NITRILE XL STR (GLOVE) IMPLANT
GLOVE SRG 8 PF TXTR STRL LF DI (GLOVE) ×1 IMPLANT
GLOVE SURG ENC MOIS LTX SZ8 (GLOVE) ×2 IMPLANT
GLOVE SURG LTX SZ8 (GLOVE) ×2 IMPLANT
GLOVE SURG UNDER POLY LF SZ8 (GLOVE) ×1
GLOVE SURG UNDER POLY LF SZ8.5 (GLOVE) ×2 IMPLANT
GOWN STRL REUS W/ TWL LRG LVL3 (GOWN DISPOSABLE) IMPLANT
GOWN STRL REUS W/ TWL XL LVL3 (GOWN DISPOSABLE) ×1 IMPLANT
GOWN STRL REUS W/TWL 2XL LVL3 (GOWN DISPOSABLE) ×2 IMPLANT
GOWN STRL REUS W/TWL LRG LVL3 (GOWN DISPOSABLE)
GOWN STRL REUS W/TWL XL LVL3 (GOWN DISPOSABLE) ×1
HEMOSTAT POWDER KIT SURGIFOAM (HEMOSTASIS) ×2 IMPLANT
KIT BASIN OR (CUSTOM PROCEDURE TRAY) ×2 IMPLANT
KIT TURNOVER KIT B (KITS) ×2 IMPLANT
NEEDLE HYPO 25X1 1.5 SAFETY (NEEDLE) ×2 IMPLANT
NEEDLE SPNL 22GX3.5 QUINCKE BK (NEEDLE) ×2 IMPLANT
NS IRRIG 1000ML POUR BTL (IV SOLUTION) ×2 IMPLANT
OIL CARTRIDGE MAESTRO DRILL (MISCELLANEOUS) ×2
PACK LAMINECTOMY NEURO (CUSTOM PROCEDURE TRAY) ×2 IMPLANT
PAD ARMBOARD 7.5X6 YLW CONV (MISCELLANEOUS) IMPLANT
PIN DISTRACTION 14MM (PIN) ×4 IMPLANT
SPONGE INTESTINAL PEANUT (DISPOSABLE) ×2 IMPLANT
SPONGE SURGIFOAM ABS GEL SZ50 (HEMOSTASIS) IMPLANT
SUT VIC AB 3-0 SH 8-18 (SUTURE) ×2 IMPLANT
TOWEL GREEN STERILE (TOWEL DISPOSABLE) ×2 IMPLANT
TOWEL GREEN STERILE FF (TOWEL DISPOSABLE) ×2 IMPLANT
WATER STERILE IRR 1000ML POUR (IV SOLUTION) ×2 IMPLANT

## 2020-10-12 NOTE — Brief Op Note (Signed)
10/12/2020  4:20 PM  PATIENT:  Anthony Compton  35 y.o. male  PRE-OPERATIVE DIAGNOSIS:  Herniated nucleus pulposus, cervical, cervical spondylosis, cervical stenosis, cervicalgia, cervical radiculopathy C 56 level  POST-OPERATIVE DIAGNOSIS:  Herniated nucleus pulposus, cervical, cervical spondylosis, cervical stenosis, cervicalgia, cervical radiculopathy C 56 level  PROCEDURE:  Procedure(s) with comments: Cervical 5-6 anterior cervical arthroplasty (N/A) - 3C/RM 21 with Mobi C disc arthroplasty (17 x 17 x 5 mm)  SURGEON:  Surgeon(s) and Role:    Maeola Harman, MD - Primary  PHYSICIAN ASSISTANT:   ASSISTANTS: Poteat, RN   ANESTHESIA:   general  EBL:  5 cc  BLOOD ADMINISTERED:none  DRAINS: none   LOCAL MEDICATIONS USED:  MARCAINE    and LIDOCAINE   SPECIMEN:  No Specimen  DISPOSITION OF SPECIMEN:  N/A  COUNTS:  YES  TOURNIQUET:  * No tourniquets in log *  DICTATION: Patient was brought to operating room and following the smooth and uncomplicated induction of general endotracheal anesthesia his head was placed on a donut head holder and his anterior neck was prepped and draped in usual sterile fashion. An incision was made on the left side of midline after infiltrating the skin and subcutaneous tissues with local lidocaine. The platysmal layer was incised and subplatysmal dissection was performed exposing the anterior border sternocleidomastoid muscle. Using blunt dissection the carotid sheath was kept lateral and trachea and esophagus kept medial exposing the anterior cervical spine. A bent spinal needle was placed it was felt to be the C 56 level and this was confirmed on intraoperative x-ray. Longus coli muscles were taken down from the anterior cervical spine using electrocautery and key elevator and self-retaining retractor was placed. The interspace was incised and a thorough discectomy was performed.  14 mm Distraction pins were placed. The spinal cord dura and both C 6 nerve  roots were widely decompressed. A large ruptured disc was removed which was compressing the left C 6 nerve root.  Hemostasis was assured. After trial sizing a large deep 5 mm Mobi C disc arthroplasty implant (17 x 17 x 5 mm) was sized, then tamped into position and countersunk aoppropriately.  It's positioning was confirmed with AP and lateral fluoroscopy.  Soft tissues were inspected and found to be in good repair. The wound was irrigated. The platysma layer was closed with 3-0 Vicryl stitches and the skin was reapproximated with 3-0 Vicryl subcuticular stitches. The wound was dressed with Dermabond and an occlusive dressing. Counts were correct at the end of the case. Patient was extubated and taken to recovery in stable and satisfactory condition.   PLAN OF CARE: Admit for overnight observation  PATIENT DISPOSITION:  PACU - hemodynamically stable.   Delay start of Pharmacological VTE agent (>24hrs) due to surgical blood loss or risk of bleeding: yes

## 2020-10-12 NOTE — Transfer of Care (Signed)
Immediate Anesthesia Transfer of Care Note  Patient: Anthony Compton  Procedure(s) Performed: Cervical 5-6 anterior cervical arthroplasty  Patient Location: PACU  Anesthesia Type:General  Level of Consciousness: awake, alert  and oriented  Airway & Oxygen Therapy: Patient Spontanous Breathing  Post-op Assessment: Report given to RN and Post -op Vital signs reviewed and stable  Post vital signs: Reviewed and stable  Last Vitals:  Vitals Value Taken Time  BP 113/80 10/12/20 1607  Temp    Pulse 107 10/12/20 1609  Resp 25 10/12/20 1609  SpO2 99 % 10/12/20 1609  Vitals shown include unvalidated device data.  Last Pain:  Vitals:   10/12/20 1231  TempSrc:   PainSc: 9       Patients Stated Pain Goal: 3 (10/12/20 1231)  Complications: No notable events documented.

## 2020-10-12 NOTE — Interval H&P Note (Signed)
History and Physical Interval Note:  10/12/2020 1:33 PM  Anthony Compton  has presented today for surgery, with the diagnosis of Herniated nucleus pulposus, cervical.  The various methods of treatment have been discussed with the patient and family. After consideration of risks, benefits and other options for treatment, the patient has consented to  Procedure(s) with comments: Cervical 5-6 anterior cervical arthroplasty (N/A) - 3C/RM 21 as a surgical intervention.  The patient's history has been reviewed, patient examined, no change in status, stable for surgery.  I have reviewed the patient's chart and labs.  Questions were answered to the patient's satisfaction.     Dorian Heckle

## 2020-10-12 NOTE — Op Note (Signed)
10/12/2020  4:20 PM  PATIENT:  Anthony Compton  34 y.o. male  PRE-OPERATIVE DIAGNOSIS:  Herniated nucleus pulposus, cervical, cervical spondylosis, cervical stenosis, cervicalgia, cervical radiculopathy C 56 level  POST-OPERATIVE DIAGNOSIS:  Herniated nucleus pulposus, cervical, cervical spondylosis, cervical stenosis, cervicalgia, cervical radiculopathy C 56 level  PROCEDURE:  Procedure(s) with comments: Cervical 5-6 anterior cervical arthroplasty (N/A) - 3C/RM 21 with Mobi C disc arthroplasty (17 x 17 x 5 mm)  SURGEON:  Surgeon(s) and Role:    * Jamilett Ferrante, MD - Primary  PHYSICIAN ASSISTANT:   ASSISTANTS: Poteat, RN   ANESTHESIA:   general  EBL:  5 cc  BLOOD ADMINISTERED:none  DRAINS: none   LOCAL MEDICATIONS USED:  MARCAINE    and LIDOCAINE   SPECIMEN:  No Specimen  DISPOSITION OF SPECIMEN:  N/A  COUNTS:  YES  TOURNIQUET:  * No tourniquets in log *  DICTATION: Patient was brought to operating room and following the smooth and uncomplicated induction of general endotracheal anesthesia his head was placed on a donut head holder and his anterior neck was prepped and draped in usual sterile fashion. An incision was made on the left side of midline after infiltrating the skin and subcutaneous tissues with local lidocaine. The platysmal layer was incised and subplatysmal dissection was performed exposing the anterior border sternocleidomastoid muscle. Using blunt dissection the carotid sheath was kept lateral and trachea and esophagus kept medial exposing the anterior cervical spine. A bent spinal needle was placed it was felt to be the C 56 level and this was confirmed on intraoperative x-ray. Longus coli muscles were taken down from the anterior cervical spine using electrocautery and key elevator and self-retaining retractor was placed. The interspace was incised and a thorough discectomy was performed.  14 mm Distraction pins were placed. The spinal cord dura and both C 6 nerve  roots were widely decompressed. A large ruptured disc was removed which was compressing the left C 6 nerve root.  Hemostasis was assured. After trial sizing a large deep 5 mm Mobi C disc arthroplasty implant (17 x 17 x 5 mm) was sized, then tamped into position and countersunk aoppropriately.  It's positioning was confirmed with AP and lateral fluoroscopy.  Soft tissues were inspected and found to be in good repair. The wound was irrigated. The platysma layer was closed with 3-0 Vicryl stitches and the skin was reapproximated with 3-0 Vicryl subcuticular stitches. The wound was dressed with Dermabond and an occlusive dressing. Counts were correct at the end of the case. Patient was extubated and taken to recovery in stable and satisfactory condition.   PLAN OF CARE: Admit for overnight observation  PATIENT DISPOSITION:  PACU - hemodynamically stable.   Delay start of Pharmacological VTE agent (>24hrs) due to surgical blood loss or risk of bleeding: yes  
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
## 2020-10-12 NOTE — Anesthesia Preprocedure Evaluation (Addendum)
Anesthesia Evaluation  Patient identified by MRN, date of birth, ID band Patient awake    Reviewed: Allergy & Precautions, NPO status , Patient's Chart, lab work & pertinent test results  Airway Mallampati: I  TM Distance: >3 FB Neck ROM: Full    Dental  (+) Teeth Intact, Dental Advisory Given   Pulmonary    breath sounds clear to auscultation       Cardiovascular negative cardio ROS   Rhythm:Regular Rate:Normal     Neuro/Psych Anxiety    GI/Hepatic Neg liver ROS, GERD  Medicated,  Endo/Other  negative endocrine ROS  Renal/GU negative Renal ROS     Musculoskeletal negative musculoskeletal ROS (+)   Abdominal Normal abdominal exam  (+)   Peds  Hematology negative hematology ROS (+)   Anesthesia Other Findings   Reproductive/Obstetrics                            Anesthesia Physical Anesthesia Plan  ASA: 2  Anesthesia Plan: General   Post-op Pain Management:    Induction: Intravenous  PONV Risk Score and Plan: 3 and Ondansetron, Dexamethasone and Midazolam  Airway Management Planned: Oral ETT and Video Laryngoscope Planned  Additional Equipment: None  Intra-op Plan:   Post-operative Plan: Extubation in OR  Informed Consent: I have reviewed the patients History and Physical, chart, labs and discussed the procedure including the risks, benefits and alternatives for the proposed anesthesia with the patient or authorized representative who has indicated his/her understanding and acceptance.     Dental advisory given  Plan Discussed with: CRNA  Anesthesia Plan Comments:       Anesthesia Quick Evaluation

## 2020-10-13 DIAGNOSIS — M50122 Cervical disc disorder at C5-C6 level with radiculopathy: Secondary | ICD-10-CM | POA: Diagnosis not present

## 2020-10-13 MED ORDER — HYDROCODONE-ACETAMINOPHEN 5-325 MG PO TABS: 1.0000 | ORAL_TABLET | ORAL | 0 refills | Status: AC | PRN

## 2020-10-13 NOTE — Discharge Instructions (Addendum)
Wound Care Leave incision open to air. You may shower. Do not scrub directly on incision.  Do not put any creams, lotions, or ointments on incision. Activity Walk each and every day, increasing distance each day. No lifting greater than 5 lbs.  Avoid excessive neck motion. No driving for 2 weeks; may ride as a passenger locally. Wear neck brace at all times except when showering.  If provided soft collar, may wear for comfort unless otherwise instructed. Diet Resume your normal diet.  Return to Work Will be discussed at you follow up appointment. Call Your Doctor If Any of These Occur Redness, drainage, or swelling at the wound.  Temperature greater than 101 degrees. Severe pain not relieved by pain medication. Increased difficulty swallowing. Incision starts to come apart. Follow Up Appt Call today for appointment in 3-4 weeks ([phone]) or for problems.  If you have any hardware placed in your spine, you will need an x-ray before your appointment.           Anterior Cervical Fusion Care After Pinching of the nerves is a common cause of long-term pain. When this happens, a procedure called an anterior cervical fusion is sometimes performed. It relieves the pressure on the pinched nerve roots or spinal cord in the neck. An anterior cervical fusion means that the operation is done through the front (anterior) of your neck to fuse bones in your neck together. This procedure is done to relieve the pressure on pinched nerve roots or spinal cord. This operation is done to control the movement of your spine, which may be pressing on the nerves. This may relieve the pain. The procedure that stops the movement of the spine is called a fusion. The cut by the surgeon (incision) is usually within a skin fold line under your chin. After moving the neck muscles gently apart, the neurosurgeon uses an operating microscope and removes the injured intervertebral disk (the cushion or pad of tissue  between the bones of the spine). This takes the pressure off the nerves or spinal cord. This is called decompression. The area where the disc was removed is then filled with a bone graft. The graft will fuse the vertebrae together over time. This means it causes the vertebral bodies to grow together. The bone graft may be obtained from your own bone (your hip for example), or may be obtained from a bone bank. Receiving bone from a bone bank is similar to a blood bank, only the bone comes from human donors who have recently died. This type of graft is referred to as allograft bone. The preformed bone plug is safe and will not be rejected by your body. It does not contain blood cells. In some cases, the surgeon may use hardware in your neck to help stabilize it. This means that metal plates or pins or screws may be used to: Provide extra support to the neck.  Help the bones to grow together more easily.  A cervical fusion procedure takes a couple hours to several hours, depending on what needs to be done. Your caregiver will be able to answer your questions for you. HOME CARE INSTRUCTIONS  It will be normal to have a sore throat and have difficulty swallowing foods for a couple weeks following surgery. See your caregiver if this seems to be getting worse rather than better.  You may resume normal diet and activities as directed or allowed. Generally, walking and stair climbing are fine. Avoid lifting more than ten pounds and  do no lifting above your head.  If given a cervical collar, remove only for bathing and eating, or as directed.  Use only showers for cleaning up, with no bathing, until seen.  You may apply ice to the surgical or bone donor site for 15 to 20 minutes each hour while awake for the first couple days following surgery. Put the ice in a plastic bag and place a towel between the bag of ice and your skin.  Change dressings if necessary or as directed.  You may drive in 10 days  Take  prescribed medication as directed. Only take over-the-counter or prescription medicines for pain, discomfort, or fever as directed by your caregiver.  Make an appointment to see your caregiver for suture or staple removal when instructed.  If physical therapy was prescribed, follow your caregiver's directions.  SEEK IMMEDIATE MEDICAL CARE IF: There is redness, swelling, or increasing pain in the wound.  There is pus coming from the wound.  An unexplained oral temperature over 102 F (38.9 C) develops.  There is a bad smell coming from the wound or dressing.  You have swelling in your calf or leg.  You develop shortness of breath or chest pain.  The wound edges break open after sutures or staples have been removed.  Your pain is not controlled with medicine.  You seem to be getting worse rather than better.  Document Released: 10/09/2003 Document Revised: 11/06/2010 Document Reviewed: 12/15/2007 [facility] Patient Information 2012 [place], [place].

## 2020-10-13 NOTE — Evaluation (Signed)
Occupational Therapy Evaluation Patient Details Name: Anthony Compton MRN: [id number] DOB: September 12, 1985 Today's Date: 10/13/2020    History of Present Illness Pt is a 35 yr old male who s/p cervical 5-6 anterior cervical arthroplasty due to pain and numbness. PMH: pain and numbness in UEs   Clinical Impression   Patient is s/p cervical 5-6 anterior cervical arthroplasty surgery resulting in the deficits listed below (see OT Problem List).  Pt at PLOF was using a walking stick and increase time to complete tasks due to pain and numbness. Pt was educated on precautions and DME/AE to be able to follow with return to home. Pt was able to complete UE/LE dressing with sitting to standing with supervision to cue pt to follow precautions. Pt was able to complete room level ambulation with min guard to supervision and transfers with supervision.  Patient will benefit from skilled OT to increase their safety and independence with ADL and functional mobility for ADL (while adhering to their precautions) to facilitate discharge to venue listed below.      Follow Up Recommendations  Follow surgeon's recommendation for DC plan and follow-up therapies;Supervision - Intermittent    Equipment Recommendations  Tub/shower bench (long handle reacher and sponge)    Recommendations for Other Services       Precautions / Restrictions Precautions Precautions: Cervical Precaution Booklet Issued: Yes (comment) Precaution Comments: Pt reviewed precautions and requires cues Required Braces or Orthoses: Cervical Brace Cervical Brace: Soft collar Restrictions Weight Bearing Restrictions: No      Mobility Bed Mobility Overal bed mobility: Needs Assistance Bed Mobility: Rolling;Supine to Sit Rolling: Supervision   Supine to sit: Supervision     General bed mobility comments: cues on how to complete log roll    Transfers Overall transfer level: Needs assistance   Transfers: Sit to/from Stand Sit to  Stand: Supervision (cues on hand placement)              Balance Overall balance assessment: Mild deficits observed, not formally tested                                         ADL either performed or assessed with clinical judgement   ADL Overall ADL's : Needs assistance/impaired Eating/Feeding: Independent;Sitting   Grooming: Wash/dry hands;Wash/dry face;Supervision/safety;Cueing for safety;Cueing for sequencing;Standing   Upper Body Bathing: Supervision/ safety;Cueing for safety;Cueing for sequencing;Sitting;Standing   Lower Body Bathing: Supervison/ safety;Sit to/from stand;Cueing for safety;Cueing for sequencing   Upper Body Dressing : Set up;Cueing for sequencing;Sitting   Lower Body Dressing: Supervision/safety;Cueing for safety;Cueing for sequencing;Sit to/from stand   Toilet Transfer: Cueing for safety;Cueing for sequencing;Ambulation;Supervision/safety   Toileting- Clothing Manipulation and Hygiene: Supervision/safety;Cueing for safety;Cueing for sequencing;Sit to/from stand   Tub/ Shower Transfer: Tub transfer;Min guard;Cueing for safety;Cueing for sequencing;Ambulation;Tub bench   Functional mobility during ADLs: Cueing for safety;Cueing for sequencing;Min guard General ADL Comments: Pt reported still feeling some unsteady but better then PLOF     Vision Baseline Vision/History: Wears glasses       Perception     Praxis      Pertinent Vitals/Pain Pain Assessment: Faces Faces Pain Scale: Hurts a little bit Pain Location: cervical/throat Pain Descriptors / Indicators: Aching;Operative site guarding Pain Intervention(s): Limited activity within patient's tolerance     Hand Dominance Right   Extremity/Trunk Assessment Upper Extremity Assessment Upper Extremity Assessment: Generalized weakness (due to prior level of pain/limited  grip)   Lower Extremity Assessment Lower Extremity Assessment: Defer to PT evaluation   Cervical / Trunk  Assessment Cervical / Trunk Assessment: Other exceptions (s/p sx)   Communication Communication Communication: No difficulties   Cognition Arousal/Alertness: Awake/alert Behavior During Therapy: WFL for tasks assessed/performed Overall Cognitive Status: Within Functional Limits for tasks assessed                                     General Comments       Exercises     Shoulder Instructions      Home Living Family/patient expects to be discharged to:: Private residence Living Arrangements: Spouse/significant other Available Help at Discharge: Family (partner) Reports also other family/neighbors to assist as needed.  Type of Home: House (Condo) Home Access: Stairs to enter [employer] of Steps: 3 Entrance Stairs-Rails: Right Home Layout: One level     Bathroom Shower/Tub: IT trainer: Standard Bathroom Accessibility: Yes   Home Equipment:  (walking stick)          Prior Functioning/Environment Level of Independence: Needs assistance  Gait / Transfers Assistance Needed: limited ambulation due to pain and required walking stick ADL's / Homemaking Assistance Needed: reported dropping items due to numbness            OT Problem List: Decreased strength;Decreased activity tolerance;Impaired balance (sitting and/or standing);Decreased safety awareness;Decreased knowledge of use of DME or AE;Decreased knowledge of precautions;Pain      OT Treatment/Interventions: Self-care/ADL training;Therapeutic exercise;Therapeutic activities;Patient/family education;Balance training    OT Goals(Current goals can be found in the care plan section) Acute Rehab OT Goals Patient Stated Goal: to continue to get better OT Goal Formulation: With patient Time For Goal Achievement: 10/27/20 Potential to Achieve Goals: Good ADL Goals Pt Will Perform Tub/Shower Transfer: Tub transfer;with modified independence;tub  bench;ambulating Additional ADL Goal #1: Pt will be able to complete standing ADL/IADLs for 15 min with no LOB to be able to return to work  OT Frequency: Min 2X/week   Barriers to D/C:            Co-evaluation              AM-PAC OT "6 Clicks" Daily Activity     Outcome Measure Help from another person eating meals?: None Help from another person taking care of personal grooming?: None Help from another person toileting, which includes using toliet, bedpan, or urinal?: A Little Help from another person bathing (including washing, rinsing, drying)?: A Little Help from another person to put on and taking off regular upper body clothing?: None Help from another person to put on and taking off regular lower body clothing?: A Little 6 Click Score: 21   End of Session Equipment Utilized During Treatment: Gait belt  Activity Tolerance: Patient tolerated treatment well Patient left: in bed;with call bell/phone within reach  OT Visit Diagnosis: Unsteadiness on feet (R26.81);Muscle weakness (generalized) (M62.81);Pain Pain - Right/Left:  (shoulder/cervical) Pain - part of body:  (cervical)                Time: 2706-2376 OT Time Calculation (min): 30 min Charges:  OT General Charges $OT Visit: 1 Visit OT Evaluation $OT Eval Low Complexity: 1 Low OT Treatments $Self Care/Home Management : 8-22 mins  Alphia Moh OTR/L  Acute Rehab Services  [phone] office number [phone] pager number   Alphia Moh 10/13/2020, 8:45 AM

## 2020-10-13 NOTE — Discharge Summary (Signed)
Physician Discharge Summary  Patient ID: Anthony Compton MRN: [id number] DOB/AGE: 09/23/1985 34 y.o.  Admit date: 10/12/2020 Discharge date: 10/13/2020  Admission Diagnoses:Herniated nucleus pulposus, cervical, cervical spondylosis, cervical stenosis, cervicalgia, cervical radiculopathy C 56 level   Discharge Diagnoses: Herniated nucleus pulposus, cervical, cervical spondylosis, cervical stenosis, cervicalgia, cervical radiculopathy C 56 level  Active Problems:   Herniated cervical disc without myelopathy   Discharged Condition: good  Hospital Course: Anthony Compton was admitted and taken to the operating room for an uncomplicated cervical arthroplasty.  Post op he is voiding, ambulating, and speaking with a normal voice. He is tolerating a normal diet. Strength is normal in his extremities. Wound is clean, dry, and without signs of infection.   Treatments: surgery:  Cervical 5-6 anterior cervical arthroplasty (N/A) - 3C/RM 21 with Mobi C disc arthroplasty (17 x 17 x 5 mm Discharge Exam: Blood pressure 130/78, pulse 75, temperature 98 F (36.7 C), temperature source Oral, resp. rate 19, height 6\' 1"  (1.854 m), weight 100.7 kg, SpO2 95 %. General appearance: alert, cooperative, appears stated age, and no distress  Disposition: Discharge disposition: 01-Home or Self Care      Herniated nucleus pulposus, cervical  Allergies as of 10/13/2020       Reactions   Sulfa Antibiotics Anaphylaxis, Hives, Swelling   Tomato Anaphylaxis, Rash, Swelling, Hives        Medication List     TAKE these medications    ADULT GUMMY PO Take 1 capsule by mouth daily.   cyclobenzaprine 10 MG tablet Commonly known as: FLEXERIL Take 10 mg by mouth 2 (two) times daily as needed for muscle spasms.   HYDROcodone-acetaminophen 5-325 MG tablet Commonly known as: NORCO/VICODIN Take 1-2 tablets by mouth every 4 (four) hours as needed for up to 7 days for severe pain ((score 7 to 10)).   hydrOXYzine 25 MG  capsule Commonly known as: VISTARIL Take 25 mg by mouth 3 (three) times daily as needed for anxiety.   omeprazole 40 MG capsule Commonly known as: PRILOSEC Take 40 mg by mouth 2 (two) times daily.   sertraline 50 MG tablet Commonly known as: ZOLOFT Take 50 mg by mouth daily.   zolpidem 10 MG tablet Commonly known as: AMBIEN Take 10 mg by mouth at bedtime.        Follow-up Information     12/13/2020, MD Follow up in 3 week(s).   Specialty: Neurosurgery Why: please call for appointment Contact information: [address] [place] [phone]                 Signed: [phone] 10/13/2020, 10:47 AM

## 2020-10-13 NOTE — Evaluation (Signed)
Physical Therapy Evaluation and Discharge Patient Details Name: Anthony Compton MRN: [id number] DOB: 09-21-1985 Today's Date: 10/13/2020   History of Present Illness  Pt is a 35 y/o male who presents s/p C5-C6 anterior cervical arthroplasty on 10/12/2020. No significant PMH noted.  Clinical Impression  Patient evaluated by Physical Therapy with no further acute PT needs identified. All education has been completed and the patient has no further questions. Pt was able to demonstrate transfers and ambulation with gross modified independence and SPC for support. Pt was educated on precautions, brace application/wearing schedule, appropriate activity progression, and car transfer. See below for any follow-up Physical Therapy or equipment needs. PT is signing off. Thank you for this referral.     Follow Up Recommendations No PT follow up;Supervision - Intermittent    Equipment Recommendations  Cane    Recommendations for Other Services       Precautions / Restrictions Precautions Precautions: Cervical Precaution Booklet Issued: Yes (comment) Precaution Comments: Reviewed handout and pt was cued for precautions during functional mobility. Required Braces or Orthoses: Cervical Brace Cervical Brace: Soft collar Restrictions Weight Bearing Restrictions: No      Mobility  Bed Mobility Overal bed mobility: Modified Independent Bed Mobility: Rolling;Supine to Sit Rolling: Supervision   Supine to sit: Supervision     General bed mobility comments: Good log roll technique.    Transfers Overall transfer level: Modified independent Equipment used: None Transfers: Sit to/from Stand Sit to Stand: Supervision (cues on hand placement)         General transfer comment: Good posture while transitioning to/from stand.  Ambulation/Gait Ambulation/Gait assistance: Modified independent (Device/Increase time) Gait Distance (Feet): 400 Feet Assistive device: Rolling walker (2  wheeled);Straight cane Gait Pattern/deviations: Step-through pattern;Decreased stride length Gait velocity: Decreased Gait velocity interpretation: 1.31 - 2.62 ft/sec, indicative of limited community ambulator General Gait Details: Slow and mildly guarded but overall with good gait pattern. Initially with RW per pt request, however was able to progress to [facility] for comfort by end of session.  Stairs            Wheelchair Mobility    Modified Rankin (Stroke Patients Only)       Balance Overall balance assessment: Mild deficits observed, not formally tested                                           Pertinent Vitals/Pain Pain Assessment: Faces Faces Pain Scale: Hurts a little bit Pain Location: incision site, throat Pain Descriptors / Indicators: Aching;Operative site guarding;Sore Pain Intervention(s): Limited activity within patient's tolerance;Monitored during session;Repositioned    Home Living Family/patient expects to be discharged to:: Private residence Living Arrangements: Spouse/significant other Available Help at Discharge: Family (partner) Type of Home: House (Condo) Home Access: Stairs to enter Entrance Stairs-Rails: Right Entrance Stairs-Number of Steps: 3 Home Layout: One level Home Equipment:  (walking stick)      Prior Function Level of Independence: Needs assistance   Gait / Transfers Assistance Needed: limited ambulation due to pain and required walking stick  ADL's / Homemaking Assistance Needed: reported dropping items due to numbness        Hand Dominance   Dominant Hand: Right    Extremity/Trunk Assessment   Upper Extremity Assessment Upper Extremity Assessment: Defer to OT evaluation    Lower Extremity Assessment Lower Extremity Assessment: Overall WFL for tasks assessed    Cervical /  Trunk Assessment Cervical / Trunk Assessment: Other exceptions Cervical / Trunk Exceptions: s/p surgery  Communication    Communication: No difficulties  Cognition Arousal/Alertness: Awake/alert Behavior During Therapy: WFL for tasks assessed/performed Overall Cognitive Status: Within Functional Limits for tasks assessed                                        General Comments      Exercises     Assessment/Plan    PT Assessment Patent does not need any further PT services  PT Problem List         PT Treatment Interventions      PT Goals (Current goals can be found in the Care Plan section)  Acute Rehab PT Goals Patient Stated Goal: to continue to get better PT Goal Formulation: All assessment and education complete, DC therapy    Frequency     Barriers to discharge        Co-evaluation               AM-PAC PT "6 Clicks" Mobility  Outcome Measure Help needed turning from your back to your side while in a flat bed without using bedrails?: None Help needed moving from lying on your back to sitting on the side of a flat bed without using bedrails?: None Help needed moving to and from a bed to a chair (including a wheelchair)?: None Help needed standing up from a chair using your arms (e.g., wheelchair or bedside chair)?: None Help needed to walk in hospital room?: None Help needed climbing 3-5 steps with a railing? : None 6 Click Score: 24    End of Session Equipment Utilized During Treatment: Cervical collar Activity Tolerance: Patient tolerated treatment well Patient left: in bed;with call bell/phone within reach Nurse Communication: Mobility status PT Visit Diagnosis: Unsteadiness on feet (R26.81);Pain Pain - part of body:  (neck)    Time: 8185-6314 PT Time Calculation (min) (ACUTE ONLY): 22 min   Charges:   PT Evaluation $PT Eval Low Complexity: 1 Low          Conni Slipper, PT, DPT Acute Rehabilitation Services Pager: [phone] Office: [phone]   Marylynn Pearson 10/13/2020, 10:26 AM

## 2020-10-13 NOTE — Progress Notes (Signed)
Patient walked down by himself toward his transport for discharge home; in no acute distress nor complaints of pain nor discomfort; room was checked and accounted for all his belongings; discharge instructions given to patient by RN and he verbalized understanding on the instructions given.

## 2020-10-15 ENCOUNTER — Encounter (HOSPITAL_COMMUNITY): Payer: Self-pay | Admitting: Neurosurgery

## 2020-10-15 NOTE — Anesthesia Postprocedure Evaluation (Signed)
Anesthesia Post Note  Patient: Anthony Compton  Procedure(s) Performed: Cervical 5-6 anterior cervical arthroplasty     Patient location during evaluation: PACU Anesthesia Type: General Level of consciousness: awake and alert Pain management: pain level controlled Vital Signs Assessment: post-procedure vital signs reviewed and stable Respiratory status: spontaneous breathing, nonlabored ventilation, respiratory function stable and patient connected to nasal cannula oxygen Cardiovascular status: blood pressure returned to baseline and stable Postop Assessment: no apparent nausea or vomiting Anesthetic complications: no   No notable events documented.            Shelton Silvas

## 2022-08-11 IMAGING — RF DG CERVICAL SPINE 2 OR 3 VIEWS
1 series · 3 of 3 positions shown · non-contrast
Comparison: Cervical spine MRI 09/02/2020.

CLINICAL DATA: Surgery, elective 8W0.S ([facility]). Additional
history provided by technologist: Cervical 5-6 anterior cervical
arthroplasty. Provided fluoroscopy time 34 seconds (6.65 mGy).

EXAM:
CERVICAL SPINE - 2-3 VIEW; DG C-ARM 1-60 MIN

[Series 1: run · 3 of 3 slices shown]
[im 1/3]
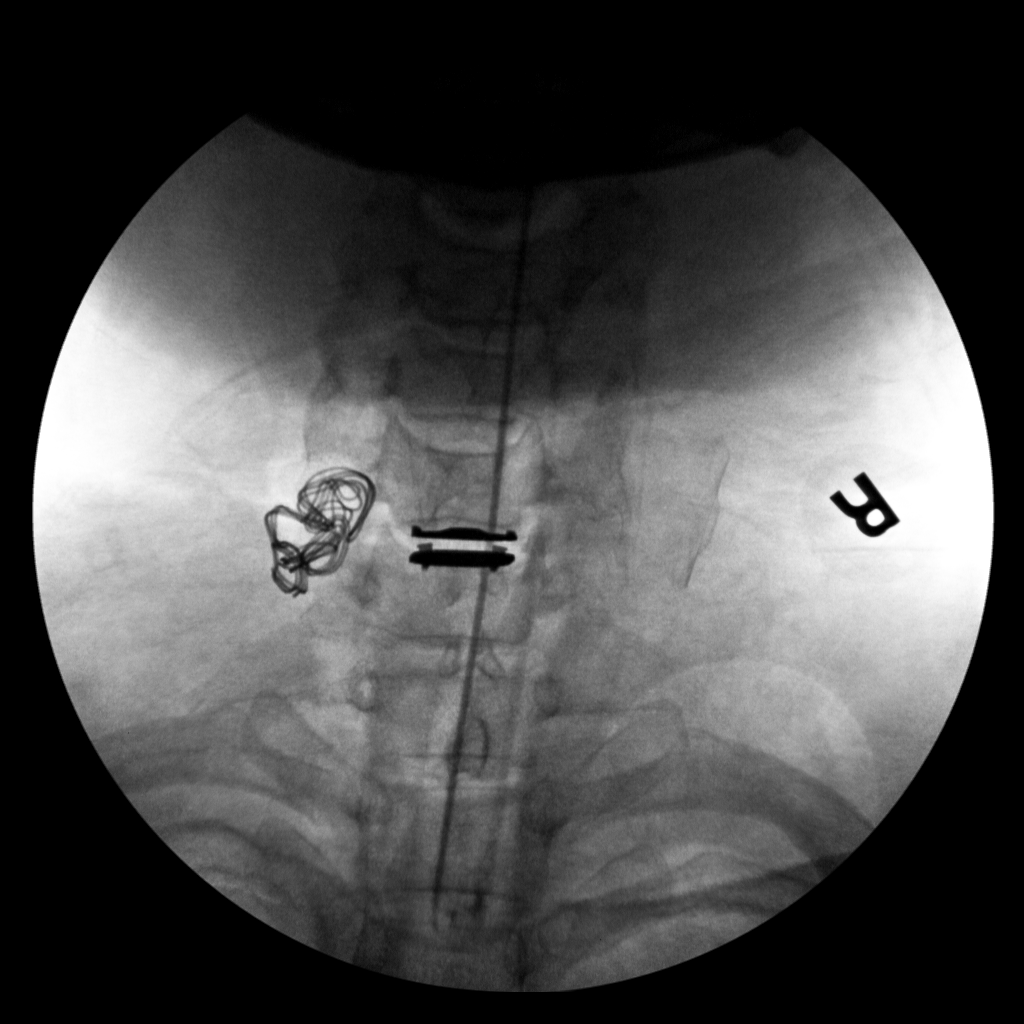
[im 2/3]
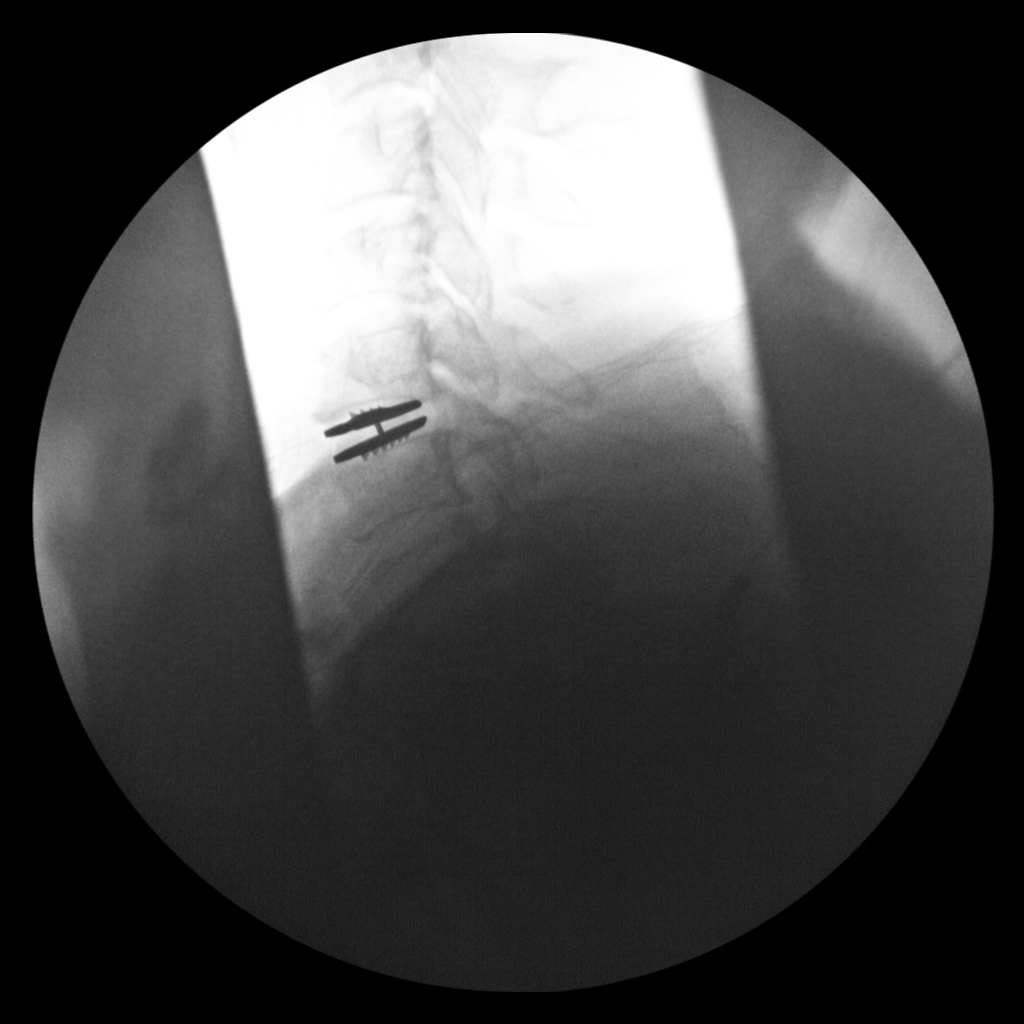
[im 3/3]
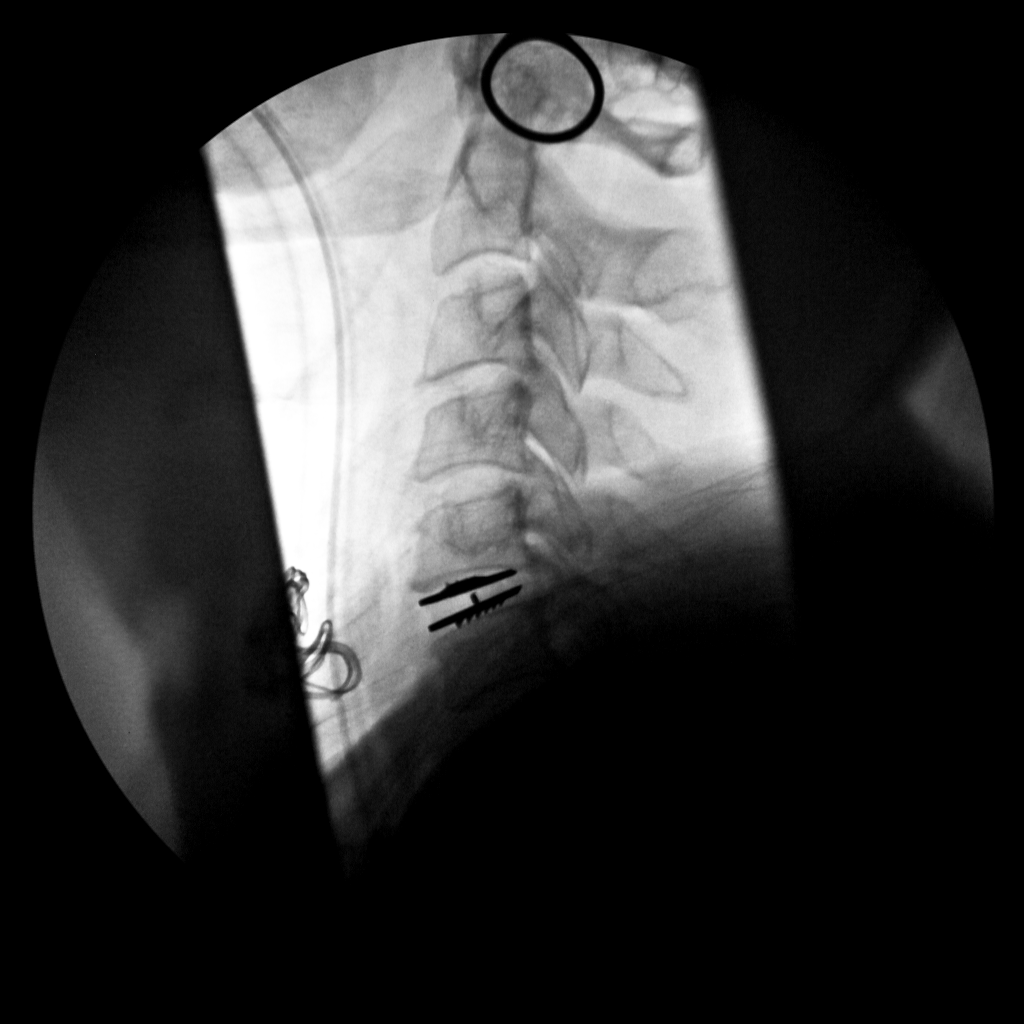

[3 of 3 positions shown; findings below may reference images not displayed]

FINDINGS: PA and lateral view intraoperative fluoroscopic images of the
cervical spine are submitted, 3 images total. On the provided
images, a disc prosthesis is present at the C5-C6 level. Curvilinear
hyperdensity projects ventral to the cervical spine at the C5-C6
level, likely reflecting surgical sponges/packing material.
Partially imaged ET tube.
IMPRESSION: Three intraoperative fluoroscopic images of the cervical spine from
C5-C6 disc arthroplasty.

Curvilinear hyperdensity projects ventral to the cervical spine at
the C5-C6 level, likely reflecting surgical sponges/packing
material. Correlate with the operative history.

## 2022-08-11 IMAGING — RF DG C-ARM 1-60 MIN
1 series · 3 of 3 positions shown · non-contrast
Comparison: Cervical spine MRI 09/02/2020.

CLINICAL DATA: Surgery, elective 8W0.S ([facility]). Additional
history provided by technologist: Cervical 5-6 anterior cervical
arthroplasty. Provided fluoroscopy time 34 seconds (6.65 mGy).

EXAM:
CERVICAL SPINE - 2-3 VIEW; DG C-ARM 1-60 MIN

[Series 1: run · 3 of 3 slices shown]
[im 1/3]
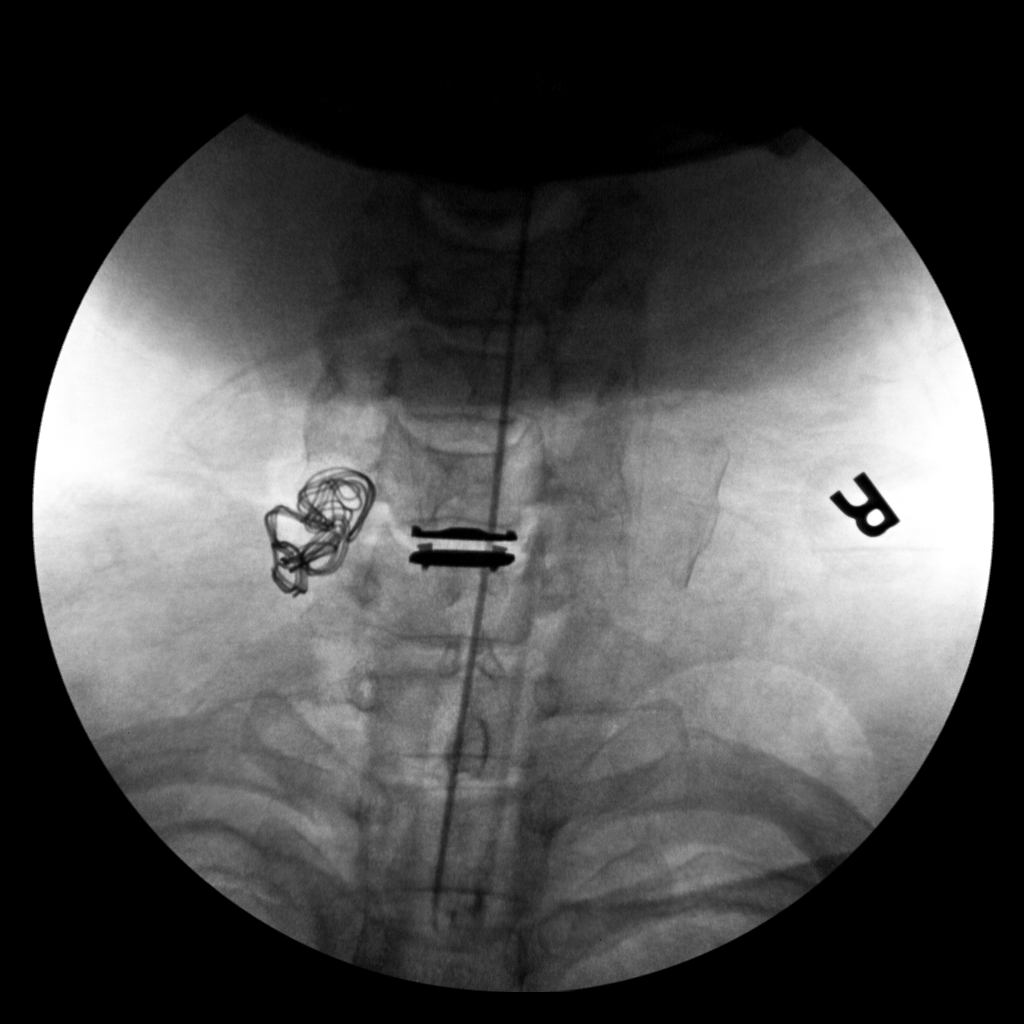
[im 2/3]
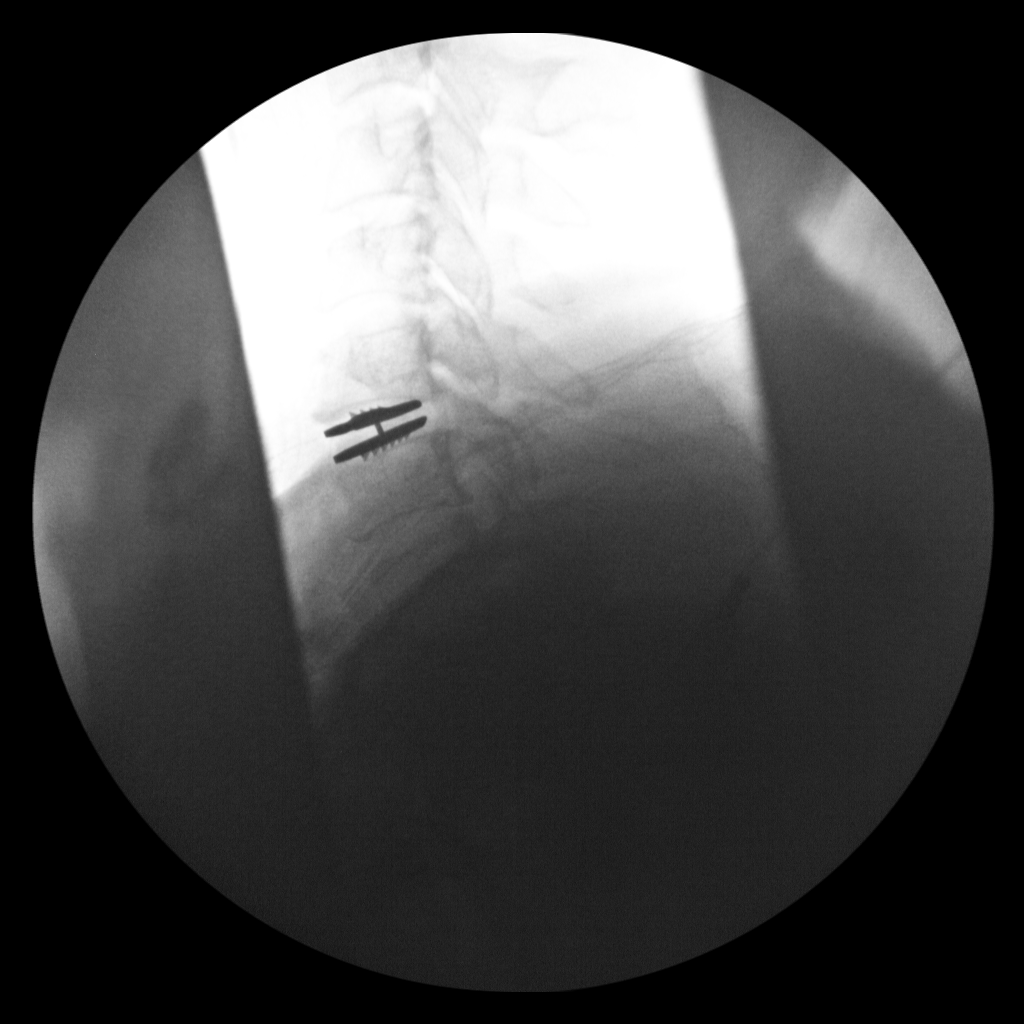
[im 3/3]
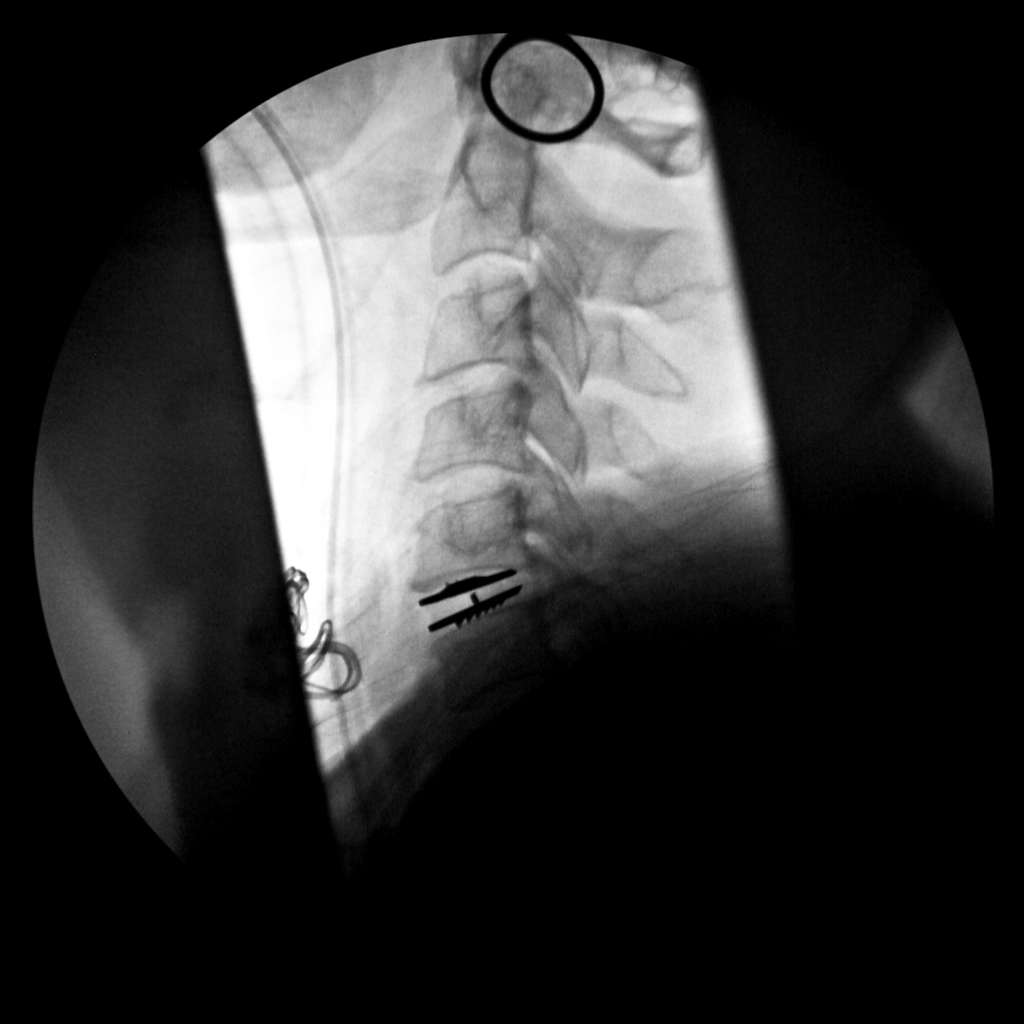

[3 of 3 positions shown; findings below may reference images not displayed]

FINDINGS: PA and lateral view intraoperative fluoroscopic images of the
cervical spine are submitted, 3 images total. On the provided
images, a disc prosthesis is present at the C5-C6 level. Curvilinear
hyperdensity projects ventral to the cervical spine at the C5-C6
level, likely reflecting surgical sponges/packing material.
Partially imaged ET tube.
IMPRESSION: Three intraoperative fluoroscopic images of the cervical spine from
C5-C6 disc arthroplasty.

Curvilinear hyperdensity projects ventral to the cervical spine at
the C5-C6 level, likely reflecting surgical sponges/packing
material. Correlate with the operative history.
